# Patient Record
Sex: Male | Born: 1980 | Race: Black or African American | Hispanic: No | Marital: Single | Smoking: Never smoker
Health system: Southern US, Community
[De-identification: ages and names within clinical notes are randomized; demographics above are authoritative.]

## PROBLEM LIST (undated history)

## (undated) DIAGNOSIS — E669 Obesity, unspecified: Secondary | ICD-10-CM

## (undated) DIAGNOSIS — I10 Essential (primary) hypertension: Secondary | ICD-10-CM

## (undated) SURGERY — OPEN REDUCTION INTERNAL FIXATION (ORIF) HUMERAL SHAFT FRACTURE
Anesthesia: General | Laterality: Right

---
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
## 2002-03-05 ENCOUNTER — Emergency Department (HOSPITAL_COMMUNITY): Admission: EM | Admit: 2002-03-05 | Discharge: 2002-03-05 | Payer: Self-pay | Admitting: Emergency Medicine

## 2002-03-05 ENCOUNTER — Encounter: Payer: Self-pay | Admitting: Emergency Medicine

## 2002-03-19 ENCOUNTER — Emergency Department (HOSPITAL_COMMUNITY): Admission: EM | Admit: 2002-03-19 | Discharge: 2002-03-19 | Payer: Self-pay | Admitting: Emergency Medicine

## 2003-07-06 ENCOUNTER — Emergency Department (HOSPITAL_COMMUNITY): Admission: EM | Admit: 2003-07-06 | Discharge: 2003-07-06 | Payer: Self-pay | Admitting: Emergency Medicine

## 2006-10-26 ENCOUNTER — Emergency Department (HOSPITAL_COMMUNITY): Admission: EM | Admit: 2006-10-26 | Discharge: 2006-10-27 | Payer: Self-pay | Admitting: Emergency Medicine

## 2007-11-22 ENCOUNTER — Emergency Department (HOSPITAL_COMMUNITY): Admission: EM | Admit: 2007-11-22 | Discharge: 2007-11-23 | Payer: Self-pay | Admitting: Emergency Medicine

## 2009-05-25 ENCOUNTER — Emergency Department (HOSPITAL_COMMUNITY): Admission: EM | Admit: 2009-05-25 | Discharge: 2009-05-25 | Payer: Self-pay | Admitting: Emergency Medicine

## 2010-03-16 ENCOUNTER — Emergency Department (HOSPITAL_COMMUNITY): Admission: EM | Admit: 2010-03-16 | Discharge: 2010-03-16 | Payer: Self-pay | Admitting: Emergency Medicine

## 2010-09-03 ENCOUNTER — Inpatient Hospital Stay (INDEPENDENT_AMBULATORY_CARE_PROVIDER_SITE_OTHER)
Admission: RE | Admit: 2010-09-03 | Discharge: 2010-09-03 | Disposition: A | Discharge status: Home / Self Care | Payer: Self-pay | Source: Ambulatory Visit | Attending: Family Medicine | Admitting: Family Medicine

## 2010-09-03 DIAGNOSIS — J309 Allergic rhinitis, unspecified: Secondary | ICD-10-CM

## 2011-04-23 ENCOUNTER — Encounter: Payer: Self-pay | Admitting: Emergency Medicine

## 2011-04-23 ENCOUNTER — Emergency Department (HOSPITAL_COMMUNITY)
Admission: EM | Admit: 2011-04-23 | Discharge: 2011-04-23 | Disposition: A | Discharge status: Home / Self Care | Payer: Self-pay | Attending: Emergency Medicine | Admitting: Emergency Medicine

## 2011-04-23 ENCOUNTER — Emergency Department (HOSPITAL_COMMUNITY): Payer: Self-pay

## 2011-04-23 DIAGNOSIS — R0789 Other chest pain: Secondary | ICD-10-CM | POA: Insufficient documentation

## 2011-04-23 DIAGNOSIS — R0989 Other specified symptoms and signs involving the circulatory and respiratory systems: Secondary | ICD-10-CM | POA: Insufficient documentation

## 2011-04-23 DIAGNOSIS — R05 Cough: Secondary | ICD-10-CM | POA: Insufficient documentation

## 2011-04-23 DIAGNOSIS — R059 Cough, unspecified: Secondary | ICD-10-CM | POA: Insufficient documentation

## 2011-04-23 DIAGNOSIS — R0609 Other forms of dyspnea: Secondary | ICD-10-CM | POA: Insufficient documentation

## 2011-04-23 DIAGNOSIS — J45901 Unspecified asthma with (acute) exacerbation: Secondary | ICD-10-CM | POA: Insufficient documentation

## 2011-04-23 DIAGNOSIS — R0602 Shortness of breath: Secondary | ICD-10-CM | POA: Insufficient documentation

## 2011-04-23 DIAGNOSIS — I10 Essential (primary) hypertension: Secondary | ICD-10-CM | POA: Insufficient documentation

## 2011-04-23 HISTORY — DX: Essential (primary) hypertension: I10

## 2011-04-23 LAB — POCT I-STAT, CHEM 8
BUN: 13 mg/dL (ref 6–23)
Calcium, Ion: 1.09 mmol/L — ABNORMAL LOW (ref 1.12–1.32)
Chloride: 98 meq/L (ref 96–112)
Creatinine, Ser: 1 mg/dL (ref 0.50–1.35)
Glucose, Bld: 156 mg/dL — ABNORMAL HIGH (ref 70–99)
HCT: 46 % (ref 39.0–52.0)
Hemoglobin: 15.6 g/dL (ref 13.0–17.0)
Potassium: 3.7 meq/L (ref 3.5–5.1)
Sodium: 140 meq/L (ref 135–145)
TCO2: 31 mmol/L (ref 0–100)

## 2011-04-23 MED ORDER — CLONIDINE HCL 0.1 MG PO TABS
0.2000 mg | ORAL_TABLET | Freq: Once | ORAL | Status: AC
  Administered 2011-04-23: 0.2 mg via ORAL
  Filled 2011-04-23: qty 2

## 2011-04-23 MED ORDER — LISINOPRIL 20 MG PO TABS: 20.0000 mg | ORAL_TABLET | Freq: Every day | ORAL | Status: DC

## 2011-04-23 MED ORDER — ALBUTEROL SULFATE HFA 108 (90 BASE) MCG/ACT IN AERS: 1.0000 | INHALATION_SPRAY | Freq: Four times a day (QID) | RESPIRATORY_TRACT | Status: DC | PRN

## 2011-04-23 MED ORDER — HYDROCHLOROTHIAZIDE 25 MG PO TABS: 25.0000 mg | ORAL_TABLET | Freq: Every day | ORAL | Status: DC

## 2011-04-23 MED ORDER — ALBUTEROL SULFATE (5 MG/ML) 0.5% IN NEBU
INHALATION_SOLUTION | RESPIRATORY_TRACT | Status: AC
  Administered 2011-04-23: 10:00:00
  Filled 2011-04-23: qty 2

## 2011-04-23 MED ORDER — ALBUTEROL SULFATE HFA 108 (90 BASE) MCG/ACT IN AERS: 2.0000 | INHALATION_SPRAY | RESPIRATORY_TRACT | Status: DC | PRN

## 2011-04-23 MED ORDER — IPRATROPIUM BROMIDE 0.02 % IN SOLN
RESPIRATORY_TRACT | Status: AC
  Administered 2011-04-23: 10:00:00
  Filled 2011-04-23: qty 2.5

## 2011-04-23 NOTE — ED Notes (Signed)
Pt.was placed on the monitor. Call bell given to him and told to call if he needs anything.

## 2011-04-23 NOTE — ED Notes (Signed)
Patient transported to X-ray 

## 2011-04-23 NOTE — ED Notes (Signed)
EMS reports initial vitals of 164/92, 104 and spo2 of 91% before nebulizer.  184/106, 120 and spo2 of 100% after nebulizer

## 2011-04-23 NOTE — ED Provider Notes (Signed)
History     CSN: [id number] Arrival date & time: 04/23/2011  9:14 AM   First MD Initiated Contact with Patient 04/23/11 [phone]      Chief Complaint  Patient presents with  . Shortness of Breath    (Consider location/radiation/quality/duration/timing/severity/associated sxs/prior treatment) HPI Patient presents with difficulty breathing since around midnight last night.  Patient has previous history of asthma and hypertension.  Patient states he's had an upper respiratory illness infection for the last few days.  Had more difficulty breathing last night.  Her name is psoriasis morning he said he was wheezing and he was given a nebulizer which opened his airways up and seemed to improve his breathing dramatically.  Patient denies any chest pain but he does state that he had tightness in his chest.  He has had a cough and URI symptoms over the last few days.  Patient does denies fever.  His symptoms began to worsen around midnight last night and had continued until he called the ambulance.  Patient has no regular doctor. Past Medical History  Diagnosis Date  . Asthma     as child  . Hypertension     History reviewed. No pertinent past surgical history.  No family history on file.  History  Substance Use Topics  . Smoking status: Not on file  . Smokeless tobacco: Not on file  . Alcohol Use:       Review of Systems  Constitutional: Negative.   HENT: Negative.   Respiratory: Positive for chest tightness and wheezing.   Cardiovascular: Negative.   Gastrointestinal: Negative.   Musculoskeletal: Negative.   Psychiatric/Behavioral: Negative.     Allergies  Review of patient's allergies indicates no known allergies.  Home Medications  No current outpatient prescriptions on file.  BP 184/106  Pulse 120  SpO2 100%  Physical Exam  Nursing note and vitals reviewed. Constitutional: He is oriented to person, place, and time. He appears well-developed and well-nourished. No  distress.  HENT:  Head: Normocephalic and atraumatic.  Eyes: Pupils are equal, round, and reactive to light.  Neck: Normal range of motion.  Cardiovascular: Normal rate and intact distal pulses.   Pulmonary/Chest: No respiratory distress. He has wheezes.  Abdominal: Normal appearance. He exhibits no distension.  Musculoskeletal: Normal range of motion.  Neurological: He is alert and oriented to person, place, and time. No cranial nerve deficit.  Skin: Skin is warm and dry. No rash noted.  Psychiatric: He has a normal mood and affect. His behavior is normal.    ED Course  Procedures (including critical care time) Patient had been getting nebulized albuterol prior to arrival states he is improved. Labs Reviewed  POCT I-STAT, CHEM 8 - Abnormal; Notable for the following:    Glucose, Bld 156 (*)    Calcium, Ion 1.09 (*)    All other components within normal limits  PRO B NATRIURETIC PEPTIDE   Dg Chest 2 View  04/23/2011  *RADIOLOGY REPORT*  Clinical Data: Shortness of breath, cough, chest tightness  CHEST - 2 VIEW  Comparison: Chest x-ray of 10/26/2006  Findings: The lungs are clear.  Mild peribronchial thickening is present.  The heart is within normal limits in size.  No bony abnormality is seen.  IMPRESSION:  No pneumonia.  Mild peribronchial thickening.  Original Report Authenticated By: Juline Patch, M.D.     1. Asthma   2. Hypertension       MDM         Nelia Shi,  MD 04/23/11 1943

## 2011-04-23 NOTE — ED Notes (Signed)
ZOX:[id number]<AV> Expected date:04/23/11<BR> Expected time:<BR> Means of arrival:<BR> Comments:<BR> Ems, asthma and sob, elevated bp

## 2011-11-09 ENCOUNTER — Emergency Department (HOSPITAL_COMMUNITY): Payer: No Typology Code available for payment source

## 2011-11-09 ENCOUNTER — Emergency Department (HOSPITAL_COMMUNITY)
Admission: EM | Admit: 2011-11-09 | Discharge: 2011-11-09 | Disposition: A | Discharge status: Home / Self Care | Payer: No Typology Code available for payment source | Attending: Emergency Medicine | Admitting: Emergency Medicine

## 2011-11-09 ENCOUNTER — Encounter (HOSPITAL_COMMUNITY): Payer: Self-pay | Admitting: Emergency Medicine

## 2011-11-09 DIAGNOSIS — J45909 Unspecified asthma, uncomplicated: Secondary | ICD-10-CM | POA: Insufficient documentation

## 2011-11-09 DIAGNOSIS — M79609 Pain in unspecified limb: Secondary | ICD-10-CM | POA: Insufficient documentation

## 2011-11-09 DIAGNOSIS — I10 Essential (primary) hypertension: Secondary | ICD-10-CM | POA: Insufficient documentation

## 2011-11-09 DIAGNOSIS — S42302A Unspecified fracture of shaft of humerus, left arm, initial encounter for closed fracture: Secondary | ICD-10-CM

## 2011-11-09 DIAGNOSIS — E669 Obesity, unspecified: Secondary | ICD-10-CM | POA: Insufficient documentation

## 2011-11-09 DIAGNOSIS — S42309A Unspecified fracture of shaft of humerus, unspecified arm, initial encounter for closed fracture: Secondary | ICD-10-CM | POA: Insufficient documentation

## 2011-11-09 HISTORY — DX: Obesity, unspecified: E66.9

## 2011-11-09 MED ORDER — OXYCODONE-ACETAMINOPHEN 5-325 MG PO TABS: 2.0000 | ORAL_TABLET | ORAL | Status: AC | PRN

## 2011-11-09 MED ORDER — OXYCODONE-ACETAMINOPHEN 5-325 MG PO TABS
2.0000 | ORAL_TABLET | Freq: Once | ORAL | Status: AC
  Administered 2011-11-09: 2 via ORAL
  Filled 2011-11-09: qty 2

## 2011-11-09 MED ORDER — ONDANSETRON 4 MG PO TBDP
4.0000 mg | ORAL_TABLET | Freq: Once | ORAL | Status: AC
  Administered 2011-11-09: 4 mg via ORAL
  Filled 2011-11-09: qty 1

## 2011-11-09 MED ORDER — FENTANYL CITRATE 0.05 MG/ML IJ SOLN
100.0000 ug | Freq: Once | INTRAMUSCULAR | Status: AC
  Administered 2011-11-09: 100 ug via INTRAMUSCULAR
  Filled 2011-11-09: qty 2

## 2011-11-09 MED ORDER — IBUPROFEN 800 MG PO TABS: 800.0000 mg | ORAL_TABLET | Freq: Three times a day (TID) | ORAL | Status: AC

## 2011-11-09 MED ORDER — HYDROMORPHONE HCL PF 1 MG/ML IJ SOLN
1.0000 mg | Freq: Once | INTRAMUSCULAR | Status: AC
  Administered 2011-11-09: 1 mg via INTRAMUSCULAR
  Filled 2011-11-09: qty 1
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
## 2011-11-09 NOTE — Discharge Instructions (Signed)
Your humerus is the bone of your upper arm and it is fractured. This means that he will likely require surgery. I have discussed your care with Dr. Melvyn Novas who is the surgeon on call and his phone number I have listed above. Please call the office Monday morning and arrange a followup visit for 5-7 days. Keep the splint and sling on your arm, keep your arm elevated above her heart and use the pain medications as prescribed. Return to the hospital immediately for numbness or weakness of the hand, a cold hands, painful hand or fevers.

## 2011-11-10 ENCOUNTER — Encounter: Payer: Self-pay | Admitting: Emergency Medicine

## 2011-11-11 ENCOUNTER — Emergency Department (INDEPENDENT_AMBULATORY_CARE_PROVIDER_SITE_OTHER)
Admission: EM | Admit: 2011-11-11 | Discharge: 2011-11-11 | Disposition: A | Discharge status: Home / Self Care | Payer: No Typology Code available for payment source | Source: Home / Self Care | Attending: Emergency Medicine | Admitting: Emergency Medicine

## 2011-11-11 ENCOUNTER — Encounter (HOSPITAL_COMMUNITY): Payer: Self-pay | Admitting: Emergency Medicine

## 2011-11-11 DIAGNOSIS — I10 Essential (primary) hypertension: Secondary | ICD-10-CM

## 2011-11-11 MED ORDER — TRIAMTERENE-HCTZ 37.5-25 MG PO TABS: 1.0000 | ORAL_TABLET | Freq: Every day | ORAL | Status: DC

## 2011-11-11 MED ORDER — LISINOPRIL 40 MG PO TABS: 20.0000 mg | ORAL_TABLET | Freq: Every day | ORAL | Status: DC

## 2011-11-11 NOTE — Discharge Instructions (Signed)
Hypertension Information As your heart beats, it forces blood through your arteries. This force is your blood pressure. If the pressure is too high, it is called hypertension (HTN) or high blood pressure. HTN is dangerous because you may have it and not know it. High blood pressure may mean that your heart has to work harder to pump blood. Your arteries may be narrow or stiff. The extra work puts you at risk for heart disease, stroke, and other problems.  Blood pressure consists of two numbers, a higher number over a lower, 110/72, for example. It is stated as "110 over 72." The ideal is below 120 for the top number (systolic) and under 80 for the bottom (diastolic).  You should pay close attention to your blood pressure if you have certain conditions such as:  Heart failure.   Prior heart attack.   Diabetes   Chronic kidney disease.   Prior stroke.   Multiple risk factors for heart disease.  To see if you have HTN, your blood pressure should be measured while you are seated with your arm held at the level of the heart. It should be measured at least twice. A one-time elevated blood pressure reading (especially in the Emergency Department) does not mean that you need treatment. There may be conditions in which the blood pressure is different between your right and left arms. It is important to see your caregiver soon for a recheck. Most people have essential hypertension which means that there is not a specific cause. This type of high blood pressure may be lowered by changing lifestyle factors such as:  Stress.   Smoking.   Lack of exercise.   Excessive weight.   Drug/tobacco/alcohol use.   Eating less salt.  Most people do not have symptoms from high blood pressure until it has caused damage to the body. Effective treatment can often prevent, delay or reduce that damage. TREATMENT  Treatment for high blood pressure, when a cause has been identified, is directed at the cause. There  are a large number of medications to treat HTN. These fall into several categories, and your caregiver will help you select the medicines that are best for you. Medications may have side effects. You should review side effects with your caregiver. If your blood pressure stays high after you have made lifestyle changes or started on medicines,   Your medication(s) may need to be changed.   Other problems may need to be addressed.   Be certain you understand your prescriptions, and know how and when to take your medicine.   Be sure to follow up with your caregiver within the time frame advised (usually within two weeks) to have your blood pressure rechecked and to review your medications.   If you are taking more than one medicine to lower your blood pressure, make sure you know how and at what times they should be taken. Taking two medicines at the same time can result in blood pressure that is too low.  Document Released: 07/29/2005 Document Revised: 02/05/2011 Document Reviewed: 08/05/2007 [employer] Patient Information 2012 [employer]. 

## 2011-11-11 NOTE — ED Notes (Signed)
Vitals obtained by CMA student 
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
## 2011-11-11 NOTE — ED Notes (Signed)
Patient requesting refill for medications: lisinopril and hctz.  Patient has been seen at orthopedic doctors office today.

## 2011-11-27 ENCOUNTER — Emergency Department (HOSPITAL_COMMUNITY): Payer: No Typology Code available for payment source

## 2011-11-27 ENCOUNTER — Encounter (HOSPITAL_COMMUNITY): Payer: Self-pay | Admitting: *Deleted

## 2011-11-27 ENCOUNTER — Emergency Department (HOSPITAL_COMMUNITY)
Admission: EM | Admit: 2011-11-27 | Discharge: 2011-11-27 | Disposition: A | Discharge status: Home / Self Care | Payer: No Typology Code available for payment source | Attending: Emergency Medicine | Admitting: Emergency Medicine

## 2011-11-27 DIAGNOSIS — Z79899 Other long term (current) drug therapy: Secondary | ICD-10-CM | POA: Insufficient documentation

## 2011-11-27 DIAGNOSIS — M79603 Pain in arm, unspecified: Secondary | ICD-10-CM

## 2011-11-27 DIAGNOSIS — S42309S Unspecified fracture of shaft of humerus, unspecified arm, sequela: Secondary | ICD-10-CM | POA: Insufficient documentation

## 2011-11-27 DIAGNOSIS — Z8781 Personal history of (healed) traumatic fracture: Secondary | ICD-10-CM | POA: Insufficient documentation

## 2011-11-27 DIAGNOSIS — I10 Essential (primary) hypertension: Secondary | ICD-10-CM | POA: Insufficient documentation

## 2011-11-27 DIAGNOSIS — J45909 Unspecified asthma, uncomplicated: Secondary | ICD-10-CM | POA: Insufficient documentation

## 2011-11-27 DIAGNOSIS — M79609 Pain in unspecified limb: Secondary | ICD-10-CM | POA: Insufficient documentation

## 2011-11-27 MED ORDER — OXYCODONE-ACETAMINOPHEN 10-325 MG PO TABS: 2.0000 | ORAL_TABLET | ORAL | Status: DC | PRN

## 2011-11-27 NOTE — Discharge Instructions (Signed)
It is very important that you continue to have your condition managed by an orthopedist.  Please make sure to call tomorrow for the next available appointment.  Your evaluation tonight to check your arm pain did not result in demonstration of a new fracture.
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
## 2011-12-23 ENCOUNTER — Encounter (HOSPITAL_COMMUNITY): Payer: Self-pay | Admitting: *Deleted

## 2011-12-23 ENCOUNTER — Other Ambulatory Visit: Payer: Self-pay | Admitting: Orthopedic Surgery

## 2011-12-24 ENCOUNTER — Observation Stay (HOSPITAL_COMMUNITY): Payer: No Typology Code available for payment source

## 2011-12-24 ENCOUNTER — Encounter (HOSPITAL_COMMUNITY): Payer: Self-pay | Admitting: Certified Registered"

## 2011-12-24 ENCOUNTER — Ambulatory Visit (HOSPITAL_COMMUNITY): Payer: No Typology Code available for payment source | Admitting: Certified Registered"

## 2011-12-24 ENCOUNTER — Ambulatory Visit (HOSPITAL_COMMUNITY): Payer: No Typology Code available for payment source

## 2011-12-24 ENCOUNTER — Encounter (HOSPITAL_COMMUNITY): Payer: Self-pay | Admitting: Surgery

## 2011-12-24 ENCOUNTER — Encounter (HOSPITAL_COMMUNITY)
Admission: RE | Disposition: A | Discharge status: Home / Self Care | Payer: Self-pay | Source: Ambulatory Visit | Attending: Orthopedic Surgery

## 2011-12-24 ENCOUNTER — Observation Stay (HOSPITAL_COMMUNITY)
Admission: RE | Admit: 2011-12-24 | Discharge: 2011-12-25 | DRG: 494 | Disposition: A | Discharge status: Home / Self Care | Payer: No Typology Code available for payment source | Source: Ambulatory Visit | Attending: Orthopedic Surgery | Admitting: Orthopedic Surgery

## 2011-12-24 DIAGNOSIS — J45909 Unspecified asthma, uncomplicated: Secondary | ICD-10-CM | POA: Diagnosis present

## 2011-12-24 DIAGNOSIS — G8918 Other acute postprocedural pain: Secondary | ICD-10-CM

## 2011-12-24 DIAGNOSIS — S42409A Unspecified fracture of lower end of unspecified humerus, initial encounter for closed fracture: Principal | ICD-10-CM | POA: Diagnosis present

## 2011-12-24 DIAGNOSIS — I10 Essential (primary) hypertension: Secondary | ICD-10-CM | POA: Diagnosis present

## 2011-12-24 HISTORY — PX: ORIF HUMERUS FRACTURE: SHX2126

## 2011-12-24 LAB — URINALYSIS, ROUTINE W REFLEX MICROSCOPIC
Glucose, UA: NEGATIVE mg/dL
Hgb urine dipstick: NEGATIVE
Specific Gravity, Urine: 1.027 (ref 1.005–1.030)
Urobilinogen, UA: 0.2 mg/dL (ref 0.0–1.0)

## 2011-12-24 LAB — BASIC METABOLIC PANEL
CO2: 28 mEq/L (ref 19–32)
Chloride: 99 mEq/L (ref 96–112)
Creatinine, Ser: 0.85 mg/dL (ref 0.50–1.35)

## 2011-12-24 LAB — CBC
MCV: 85.7 fL (ref 78.0–100.0)
Platelets: 327 10*3/uL (ref 150–400)
RBC: 5.03 MIL/uL (ref 4.22–5.81)
WBC: 17.1 10*3/uL — ABNORMAL HIGH (ref 4.0–10.5)

## 2011-12-24 LAB — URINE MICROSCOPIC-ADD ON

## 2011-12-24 LAB — SURGICAL PCR SCREEN
MRSA, PCR: POSITIVE — AB
Staphylococcus aureus: POSITIVE — AB

## 2011-12-24 SURGERY — OPEN REDUCTION INTERNAL FIXATION (ORIF) HUMERAL SHAFT FRACTURE
Anesthesia: General | Site: Arm Upper | Laterality: Right | Wound class: Clean

## 2011-12-24 MED ORDER — PROMETHAZINE HCL 25 MG/ML IJ SOLN: 6.2500 mg | INTRAMUSCULAR | Status: DC | PRN

## 2011-12-24 MED ORDER — HYDROCHLOROTHIAZIDE 25 MG PO TABS
25.0000 mg | ORAL_TABLET | Freq: Every day | ORAL | Status: DC
  Administered 2011-12-24 – 2011-12-25 (×2): 25 mg via ORAL
  Filled 2011-12-24 (×2): qty 1

## 2011-12-24 MED ORDER — ENOXAPARIN SODIUM 40 MG/0.4ML ~~LOC~~ SOLN
40.0000 mg | SUBCUTANEOUS | Status: DC
  Administered 2011-12-25: 40 mg via SUBCUTANEOUS
  Filled 2011-12-24 (×2): qty 0.4

## 2011-12-24 MED ORDER — ROCURONIUM BROMIDE 100 MG/10ML IV SOLN
INTRAVENOUS | Status: DC | PRN
  Administered 2011-12-24: 50 mg via INTRAVENOUS

## 2011-12-24 MED ORDER — SUCCINYLCHOLINE CHLORIDE 20 MG/ML IJ SOLN
INTRAMUSCULAR | Status: DC | PRN
  Administered 2011-12-24: 200 mg via INTRAVENOUS

## 2011-12-24 MED ORDER — MENTHOL 3 MG MT LOZG
1.0000 | LOZENGE | OROMUCOSAL | Status: DC | PRN
  Filled 2011-12-24: qty 9

## 2011-12-24 MED ORDER — PHENOL 1.4 % MT LIQD: 1.0000 | OROMUCOSAL | Status: DC | PRN

## 2011-12-24 MED ORDER — CEFAZOLIN SODIUM-DEXTROSE 2-3 GM-% IV SOLR
INTRAVENOUS | Status: AC
  Administered 2011-12-24: 2 g via INTRAVENOUS
  Filled 2011-12-24: qty 50

## 2011-12-24 MED ORDER — ALBUTEROL SULFATE HFA 108 (90 BASE) MCG/ACT IN AERS: 2.0000 | INHALATION_SPRAY | Freq: Four times a day (QID) | RESPIRATORY_TRACT | Status: DC | PRN

## 2011-12-24 MED ORDER — ONDANSETRON HCL 4 MG PO TABS: 4.0000 mg | ORAL_TABLET | Freq: Four times a day (QID) | ORAL | Status: DC | PRN

## 2011-12-24 MED ORDER — HYDROMORPHONE HCL PF 1 MG/ML IJ SOLN
INTRAMUSCULAR | Status: AC
  Filled 2011-12-24: qty 1

## 2011-12-24 MED ORDER — MUPIROCIN 2 % EX OINT
TOPICAL_OINTMENT | Freq: Once | CUTANEOUS | Status: AC
  Administered 2011-12-24: 1 via NASAL

## 2011-12-24 MED ORDER — METOCLOPRAMIDE HCL 10 MG PO TABS: 5.0000 mg | ORAL_TABLET | Freq: Three times a day (TID) | ORAL | Status: DC | PRN

## 2011-12-24 MED ORDER — PROPOFOL 10 MG/ML IV EMUL
INTRAVENOUS | Status: DC | PRN
  Administered 2011-12-24: 300 mg via INTRAVENOUS

## 2011-12-24 MED ORDER — CEFAZOLIN SODIUM 1-5 GM-% IV SOLN
1.0000 g | Freq: Four times a day (QID) | INTRAVENOUS | Status: AC
  Administered 2011-12-25: 1 g via INTRAVENOUS
  Filled 2011-12-24 (×3): qty 50

## 2011-12-24 MED ORDER — LIDOCAINE HCL (CARDIAC) 20 MG/ML IV SOLN
INTRAVENOUS | Status: DC | PRN
  Administered 2011-12-24: 40 mg via INTRAVENOUS

## 2011-12-24 MED ORDER — MEPERIDINE HCL 25 MG/ML IJ SOLN: 6.2500 mg | INTRAMUSCULAR | Status: DC | PRN

## 2011-12-24 MED ORDER — LACTATED RINGERS IV SOLN
INTRAVENOUS | Status: DC | PRN
  Administered 2011-12-24 (×2): via INTRAVENOUS

## 2011-12-24 MED ORDER — OXYCODONE-ACETAMINOPHEN 5-325 MG PO TABS
2.0000 | ORAL_TABLET | ORAL | Status: DC | PRN
  Administered 2011-12-24 – 2011-12-25 (×6): 2 via ORAL
  Filled 2011-12-24 (×5): qty 2

## 2011-12-24 MED ORDER — HYDROMORPHONE HCL PF 1 MG/ML IJ SOLN
0.5000 mg | INTRAMUSCULAR | Status: DC | PRN
  Administered 2011-12-24 – 2011-12-25 (×4): 1 mg via INTRAVENOUS
  Filled 2011-12-24 (×4): qty 1

## 2011-12-24 MED ORDER — OXYCODONE HCL 5 MG PO TABS
10.0000 mg | ORAL_TABLET | ORAL | Status: DC | PRN
  Administered 2011-12-24 – 2011-12-25 (×5): 10 mg via ORAL
  Filled 2011-12-24 (×4): qty 2

## 2011-12-24 MED ORDER — HYDROMORPHONE HCL PF 1 MG/ML IJ SOLN
0.2500 mg | INTRAMUSCULAR | Status: DC | PRN
  Administered 2011-12-24 (×5): 0.5 mg via INTRAVENOUS

## 2011-12-24 MED ORDER — ACETAMINOPHEN 10 MG/ML IV SOLN
INTRAVENOUS | Status: DC | PRN
  Administered 2011-12-24: 1000 mg via INTRAVENOUS

## 2011-12-24 MED ORDER — NEOSTIGMINE METHYLSULFATE 1 MG/ML IJ SOLN
INTRAMUSCULAR | Status: DC | PRN
  Administered 2011-12-24: 3 mg via INTRAVENOUS

## 2011-12-24 MED ORDER — METHOCARBAMOL 500 MG PO TABS
500.0000 mg | ORAL_TABLET | Freq: Four times a day (QID) | ORAL | Status: DC | PRN
Start: 2011-12-24 — End: 2011-12-25
  Administered 2011-12-24 – 2011-12-25 (×4): 500 mg via ORAL
  Filled 2011-12-24 (×4): qty 1

## 2011-12-24 MED ORDER — ONDANSETRON HCL 4 MG/2ML IJ SOLN: 4.0000 mg | Freq: Four times a day (QID) | INTRAMUSCULAR | Status: DC | PRN

## 2011-12-24 MED ORDER — OXYCODONE-ACETAMINOPHEN 5-325 MG PO TABS
ORAL_TABLET | ORAL | Status: AC
  Filled 2011-12-24: qty 2

## 2011-12-24 MED ORDER — MIDAZOLAM HCL 2 MG/2ML IJ SOLN: 0.5000 mg | Freq: Once | INTRAMUSCULAR | Status: DC | PRN

## 2011-12-24 MED ORDER — METHOCARBAMOL 500 MG PO TABS
ORAL_TABLET | ORAL | Status: AC
  Filled 2011-12-24: qty 1

## 2011-12-24 MED ORDER — MIDAZOLAM HCL 5 MG/5ML IJ SOLN
INTRAMUSCULAR | Status: DC | PRN
  Administered 2011-12-24: 2 mg via INTRAVENOUS

## 2011-12-24 MED ORDER — ACETAMINOPHEN 10 MG/ML IV SOLN
INTRAVENOUS | Status: AC
  Filled 2011-12-24: qty 100

## 2011-12-24 MED ORDER — CHLORHEXIDINE GLUCONATE 4 % EX LIQD: 60.0000 mL | Freq: Once | CUTANEOUS | Status: DC

## 2011-12-24 MED ORDER — ACETAMINOPHEN 650 MG RE SUPP: 650.0000 mg | Freq: Four times a day (QID) | RECTAL | Status: DC | PRN

## 2011-12-24 MED ORDER — ACETAMINOPHEN 325 MG PO TABS: 650.0000 mg | ORAL_TABLET | Freq: Four times a day (QID) | ORAL | Status: DC | PRN

## 2011-12-24 MED ORDER — METOCLOPRAMIDE HCL 5 MG/ML IJ SOLN: 5.0000 mg | Freq: Three times a day (TID) | INTRAMUSCULAR | Status: DC | PRN

## 2011-12-24 MED ORDER — MUPIROCIN 2 % EX OINT
TOPICAL_OINTMENT | CUTANEOUS | Status: AC
  Administered 2011-12-24: 1 via NASAL
  Filled 2011-12-24: qty 22

## 2011-12-24 MED ORDER — FENTANYL CITRATE 0.05 MG/ML IJ SOLN
INTRAMUSCULAR | Status: DC | PRN
  Administered 2011-12-24 (×2): 50 ug via INTRAVENOUS
  Administered 2011-12-24 (×2): 100 ug via INTRAVENOUS
  Administered 2011-12-24: 50 ug via INTRAVENOUS
  Administered 2011-12-24: 100 ug via INTRAVENOUS
  Administered 2011-12-24: 50 ug via INTRAVENOUS

## 2011-12-24 MED ORDER — LISINOPRIL 20 MG PO TABS
20.0000 mg | ORAL_TABLET | Freq: Every day | ORAL | Status: DC
  Administered 2011-12-24 – 2011-12-25 (×2): 20 mg via ORAL
  Filled 2011-12-24 (×2): qty 1

## 2011-12-24 MED ORDER — METHOCARBAMOL 100 MG/ML IJ SOLN
500.0000 mg | Freq: Four times a day (QID) | INTRAVENOUS | Status: DC | PRN
  Filled 2011-12-24: qty 5

## 2011-12-24 MED ORDER — GLYCOPYRROLATE 0.2 MG/ML IJ SOLN
INTRAMUSCULAR | Status: DC | PRN
  Administered 2011-12-24: 0.4 mg via INTRAVENOUS

## 2011-12-24 MED ORDER — BUPIVACAINE-EPINEPHRINE PF 0.5-1:200000 % IJ SOLN
INTRAMUSCULAR | Status: DC | PRN
  Administered 2011-12-24: 30 mL

## 2011-12-24 MED ORDER — ONDANSETRON HCL 4 MG/2ML IJ SOLN
INTRAMUSCULAR | Status: DC | PRN
  Administered 2011-12-24: 4 mg via INTRAVENOUS

## 2011-12-24 MED ORDER — OXYCODONE HCL 5 MG PO TABS
ORAL_TABLET | ORAL | Status: AC
  Filled 2011-12-24: qty 2

## 2011-12-24 MED ORDER — CEFAZOLIN SODIUM-DEXTROSE 2-3 GM-% IV SOLR: 2.0000 g | INTRAVENOUS | Status: DC

## 2011-12-24 MED ORDER — OXYCODONE-ACETAMINOPHEN 10-325 MG PO TABS: 2.0000 | ORAL_TABLET | ORAL | Status: DC | PRN

## 2011-12-24 MED ORDER — 0.9 % SODIUM CHLORIDE (POUR BTL) OPTIME
TOPICAL | Status: DC | PRN
  Administered 2011-12-24: 1000 mL

## 2011-12-24 SURGICAL SUPPLY — 45 items
3.8MM DRILL BIT ×1 IMPLANT
BANDAGE ELASTIC 6 VELCRO ST LF (GAUZE/BANDAGES/DRESSINGS) ×1 IMPLANT
BANDAGE GAUZE ELAST BULKY 4 IN (GAUZE/BANDAGES/DRESSINGS) ×2 IMPLANT
BIT DRILL 3.2 QC DISP (BIT) ×1 IMPLANT
CLOTH BEACON ORANGE TIMEOUT ST (SAFETY) ×2 IMPLANT
DRAPE C-ARM 42X72 X-RAY (DRAPES) ×2 IMPLANT
DRAPE INCISE IOBAN 66X45 STRL (DRAPES) ×1 IMPLANT
DRSG PAD ABDOMINAL 8X10 ST (GAUZE/BANDAGES/DRESSINGS) ×4 IMPLANT
DURAPREP 26ML APPLICATOR (WOUND CARE) ×1 IMPLANT
ELECT REM PT RETURN 9FT ADLT (ELECTROSURGICAL) ×2
ELECTRODE REM PT RTRN 9FT ADLT (ELECTROSURGICAL) ×1 IMPLANT
GAUZE XEROFORM 1X8 LF (GAUZE/BANDAGES/DRESSINGS) ×1 IMPLANT
GAUZE XEROFORM 5X9 LF (GAUZE/BANDAGES/DRESSINGS) ×1 IMPLANT
GLOVE ECLIPSE 7.0 STRL STRAW (GLOVE) ×1 IMPLANT
GLOVE SS PI 9.0 STRL (GLOVE) ×4 IMPLANT
GOWN PREVENTION PLUS XLARGE (GOWN DISPOSABLE) ×2 IMPLANT
GOWN STRL NON-REIN LRG LVL3 (GOWN DISPOSABLE) ×3 IMPLANT
KIT BASIN OR (CUSTOM PROCEDURE TRAY) ×2 IMPLANT
KIT ROOM TURNOVER OR (KITS) ×2 IMPLANT
NEEDLE 22X1 1/2 (OR ONLY) (NEEDLE) IMPLANT
NS IRRIG 1000ML POUR BTL (IV SOLUTION) ×2 IMPLANT
PACK SHOULDER (CUSTOM PROCEDURE TRAY) ×2 IMPLANT
PAD ARMBOARD 7.5X6 YLW CONV (MISCELLANEOUS) ×4 IMPLANT
PLATE LOCK COMP 7H (Plate) ×1 IMPLANT
SCREW LOCK CORT HEX 4.5X34 (Screw) ×1 IMPLANT
SCREW LOCK CORT HEX 4.5X38 (Screw) ×1 IMPLANT
SCREW NLOCK CORT 4.5X34 (Screw) ×1 IMPLANT
SCREW NLOCK CORT 4.5X40 (Screw) ×1 IMPLANT
SCREW NLOCK CORT 4.5X42 (Screw) ×2 IMPLANT
SCREW NLOCK CORT 4.5X44 (Screw) ×1 IMPLANT
SLING ARM IMMOBILIZER XL (CAST SUPPLIES) ×1 IMPLANT
SPONGE GAUZE 4X4 12PLY (GAUZE/BANDAGES/DRESSINGS) ×2 IMPLANT
SPONGE LAP 18X18 X RAY DECT (DISPOSABLE) ×1 IMPLANT
SPONGE LAP 4X18 X RAY DECT (DISPOSABLE) ×4 IMPLANT
STAPLER VISISTAT 35W (STAPLE) ×2 IMPLANT
SUCTION FRAZIER TIP 10 FR DISP (SUCTIONS) ×2 IMPLANT
SUT VIC AB 0 CT1 27 (SUTURE) ×4
SUT VIC AB 0 CT1 27XBRD ANBCTR (SUTURE) ×2 IMPLANT
SUT VIC AB 2-0 CT1 27 (SUTURE) ×4
SUT VIC AB 2-0 CT1 TAPERPNT 27 (SUTURE) ×2 IMPLANT
SYR CONTROL 10ML LL (SYRINGE) IMPLANT
TOWEL OR 17X24 6PK STRL BLUE (TOWEL DISPOSABLE) ×2 IMPLANT
TOWEL OR 17X26 10 PK STRL BLUE (TOWEL DISPOSABLE) ×2 IMPLANT
TUBE CONNECTING 12X1/4 (SUCTIONS) ×1 IMPLANT
YANKAUER SUCT BULB TIP NO VENT (SUCTIONS) ×3 IMPLANT

## 2011-12-24 NOTE — Anesthesia Postprocedure Evaluation (Signed)
  Anesthesia Post-op Note  Patient: Justin Brown  Procedure(s) Performed: Procedure(s) (LRB): OPEN REDUCTION INTERNAL FIXATION (ORIF) HUMERAL SHAFT FRACTURE (Right)  Patient Location: PACU  Anesthesia Type: GA combined with regional for post-op pain  Level of Consciousness: awake, alert  and oriented  Airway and Oxygen Therapy: Patient Spontanous Breathing and Patient connected to nasal cannula oxygen  Post-op Pain: mild  Post-op Assessment: Post-op Vital signs reviewed, Patient's Cardiovascular Status Stable, Respiratory Function Stable, Patent Airway, No signs of Nausea or vomiting and Pain level controlled  Post-op Vital Signs: Reviewed and stable  Complications: No apparent anesthesia complications

## 2011-12-24 NOTE — Anesthesia Preprocedure Evaluation (Addendum)
Anesthesia Evaluation  Patient identified by MRN, date of birth, ID band Patient awake    Reviewed: Allergy & Precautions, H&P , NPO status , Patient's Chart, lab work & pertinent test results  History of Anesthesia Complications Negative for: history of anesthetic complications (no prior surgery)  Airway Mallampati: III TM Distance: >3 FB Neck ROM: Full    Dental No notable dental hx. (+) Teeth Intact and Dental Advisory Given   Pulmonary asthma ,  breath sounds clear to auscultation  Pulmonary exam normal       Cardiovascular hypertension, Pt. on medications Rhythm:Regular Rate:Normal     Neuro/Psych negative neurological ROS     GI/Hepatic negative GI ROS, Neg liver ROS,   Endo/Other  Morbid obesity  Renal/GU negative Renal ROS     Musculoskeletal   Abdominal (+) + obese,   Peds  Hematology negative hematology ROS (+)   Anesthesia Other Findings   Reproductive/Obstetrics                          Anesthesia Physical Anesthesia Plan  ASA: III  Anesthesia Plan: General   Post-op Pain Management:    Induction: Intravenous and Rapid sequence  Airway Management Planned: Oral ETT  Additional Equipment:   Intra-op Plan:   Post-operative Plan: Extubation in OR  Informed Consent: I have reviewed the patients History and Physical, chart, labs and discussed the procedure including the risks, benefits and alternatives for the proposed anesthesia with the patient or authorized representative who has indicated his/her understanding and acceptance.   Dental advisory given  Plan Discussed with: Anesthesiologist, Surgeon and CRNA  Anesthesia Plan Comments: (Plan routine monitors, GETA (RSI) with interscalene block for post op analgesia)       Anesthesia Quick Evaluation
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
## 2011-12-24 NOTE — Brief Op Note (Signed)
12/24/2011  1:57 PM  PATIENT:  Justin Brown  31 y.o. male  PRE-OPERATIVE DIAGNOSIS:  FRACTURED RIGHT HUMERUS  POST-OPERATIVE DIAGNOSIS:  FRACTURED RIGHT HUMERUS  PROCEDURE:  Procedure(s) (LRB): OPEN REDUCTION INTERNAL FIXATION (ORIF) HUMERAL SHAFT FRACTURE (Right)  SURGEON:  Surgeon(s) and Role:    * Kennieth Rad, MD - Primary  PHYSICIAN ASSISTANT:   ASSISTANTS: none   ANESTHESIA:   general  EBL:  Total I/O In: 1700 [I.V.:1700] Out: 100 [Blood:100]  BLOOD ADMINISTERED:none  DRAINS: none   LOCAL MEDICATIONS USED:  NONE  SPECIMEN:  No Specimen  DISPOSITION OF SPECIMEN:  N/A  COUNTS:  YES  TOURNIQUET:  * No tourniquets in log *  DICTATION: .Other Dictation: Dictation Number [id number]  PLAN OF CARE: Admit for overnight observation  PATIENT DISPOSITION:  PACU - hemodynamically stable.   Delay start of Pharmacological VTE agent (>24hrs) due to surgical blood loss or risk of bleeding: not applicable

## 2011-12-24 NOTE — Progress Notes (Signed)
Dr. Michelle Piper notified of patient having one cup of water after 0300 this am. Dr. Michelle Piper said "ok"

## 2011-12-24 NOTE — Transfer of Care (Signed)
Immediate Anesthesia Transfer of Care Note  Patient: Justin Brown  Procedure(s) Performed: Procedure(s) (LRB): OPEN REDUCTION INTERNAL FIXATION (ORIF) HUMERAL SHAFT FRACTURE (Right)  Patient Location: PACU  Anesthesia Type: General  Level of Consciousness: awake, alert , oriented and patient cooperative  Airway & Oxygen Therapy: Patient Spontanous Breathing and Patient connected to face mask oxygen  Post-op Assessment: Report given to PACU RN, Post -op Vital signs reviewed and stable and Patient moving all extremities  Post vital signs: Reviewed and stable  Complications: No apparent anesthesia complications
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
## 2011-12-24 NOTE — Anesthesia Procedure Notes (Addendum)
Anesthesia Regional Block:  Interscalene brachial plexus block  Pre-Anesthetic Checklist: ,, timeout performed, Correct Patient, Correct Site, Correct Laterality, Correct Procedure, Correct Position, site marked, Risks and benefits discussed,  Surgical consent,  Pre-op evaluation,  At surgeon's request and post-op pain management  Laterality: Right  Prep: chloraprep       Needles:  Injection technique: Single-shot  Needle Type: Echogenic Stimulator Needle     Needle Length: 5cm 5 cm     Additional Needles:  Procedures: ultrasound guided and nerve stimulator Interscalene brachial plexus block  Nerve Stimulator or Paresthesia:  Response: 0.4 mA,   Additional Responses:   Narrative:  Start time: 12/24/2011 8:20 AM End time: 12/24/2011 8:40 AM Injection made incrementally with aspirations every 5 mL. Anesthesiologist: Arta Bruce MD  Additional Notes: Monitors applied. Patient sedated. Sterile prep and drape,hand hygiene and sterile gloves were used. Relevant anatomy identified.Needle position confirmed.Local anesthetic injected incrementally after negative aspiration. Local anesthetic spread visualized around nerve(s). Vascular puncture avoided. No complications. Image printed for medical record.The patient tolerated the procedure well.       Interscalene brachial plexus block Procedure Name: Intubation Date/Time: 12/24/2011 10:29 AM Performed by: Arlice Colt B Pre-anesthesia Checklist: Patient identified, Emergency Drugs available, Suction available, Patient being monitored and Timeout performed Patient Re-evaluated:Patient Re-evaluated prior to inductionOxygen Delivery Method: Circle system utilized Preoxygenation: Pre-oxygenation with 100% oxygen Intubation Type: IV induction and Rapid sequence Grade View: Grade I Tube type: Oral Tube size: 7.5 mm Number of attempts: 1 Airway Equipment and Method: Video-laryngoscopy and Stylet Placement Confirmation: ETT inserted  through vocal cords under direct vision,  positive ETCO2 and breath sounds checked- equal and bilateral Secured at: 23 cm Tube secured with: Tape Dental Injury: Teeth and Oropharynx as per pre-operative assessment

## 2011-12-25 MED ORDER — OXYCODONE-ACETAMINOPHEN 5-325 MG PO TABS: 2.0000 | ORAL_TABLET | ORAL | Status: AC | PRN

## 2011-12-25 MED ORDER — MUPIROCIN 2 % EX OINT
TOPICAL_OINTMENT | Freq: Two times a day (BID) | CUTANEOUS | Status: DC
  Administered 2011-12-25: 10:00:00 via NASAL
  Filled 2011-12-25: qty 22

## 2011-12-25 MED ORDER — OXYCODONE HCL 10 MG PO TABS: 10.0000 mg | ORAL_TABLET | ORAL | Status: AC | PRN

## 2011-12-25 MED ORDER — DEXAMETHASONE 0.75 MG PO TABS: 4.0000 mg | ORAL_TABLET | Freq: Once | ORAL | Status: AC

## 2011-12-25 MED ORDER — OXYCODONE-ACETAMINOPHEN 7.5-325 MG PO TABS: 1.0000 | ORAL_TABLET | ORAL | Status: AC | PRN
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
## 2011-12-25 NOTE — Progress Notes (Deleted)
Occupational Therapy Evaluation Patient Details Name: Justin Brown MRN: [id number] DOB: 04-13-1981 Today's Date: 12/25/2011 Time: 0945-1000 OT Time Calculation (min): 15 min  OT Assessment / Plan / Recommendation Clinical Impression  31 yo s/p 1 level fusion L4- 5. Pt will benefit from skilled Ot services to max independence with ADL and functional moiblity for ADL to facilitate D/C home with wife.    OT Assessment  Patient needs continued OT Services    Follow Up Recommendations  No OT follow up    Barriers to Discharge None    Equipment Recommendations  3 in 1 bedside comode    Recommendations for Other Services    Frequency  Min 2X/week    Precautions / Restrictions Precautions Precautions: Back Precaution Booklet Issued: Yes (comment) Required Braces or Orthoses: Spinal Brace Spinal Brace: Lumbar corset Restrictions Weight Bearing Restrictions: No   Pertinent Vitals/Pain 6. PCA    ADL  Eating/Feeding: Simulated;Independent Where Assessed - Eating/Feeding: Chair Grooming: Simulated;Supervision/safety Where Assessed - Grooming: Supported standing Upper Body Bathing: Simulated;Set up Where Assessed - Upper Body Bathing: Unsupported sitting Lower Body Bathing: Simulated;Moderate assistance Where Assessed - Lower Body Bathing: Unsupported sit to stand Upper Body Dressing: Simulated;Minimal assistance Where Assessed - Upper Body Dressing: Unsupported sitting Lower Body Dressing: Performed;Moderate assistance Where Assessed - Lower Body Dressing: Unsupported sit to stand Toilet Transfer: Performed;Supervision/safety Toilet Transfer Method: Sit to Barista: Bedside commode Toileting - Clothing Manipulation and Hygiene: Simulated;Minimal assistance Where Assessed - Engineer, mining and Hygiene: Sit to stand from 3-in-1 or toilet Tub/Shower Transfer: Simulated;Supervision/safety Tub/Shower Transfer Method: Stand  pivot;Ambulating Tub/Shower Transfer Equipment: Walk in shower Equipment Used: Back brace Transfers/Ambulation Related to ADLs: S ADL Comments: no knowledge of back precuaitons with ADL    OT Diagnosis: Generalized weakness;Acute pain  OT Problem List: Decreased strength;Decreased range of motion;Decreased activity tolerance;Decreased safety awareness;Decreased knowledge of use of DME or AE;Decreased knowledge of precautions;Pain OT Treatment Interventions: Self-care/ADL training;DME and/or AE instruction;Therapeutic activities;Patient/family education   OT Goals Acute Rehab OT Goals OT Goal Formulation: With patient Time For Goal Achievement: 01/01/12 Potential to Achieve Goals: Good ADL Goals Pt Will Perform Lower Body Bathing: with supervision;with caregiver independent in assisting;Unsupported;Sit to stand from chair;with adaptive equipment ADL Goal: Lower Body Bathing - Progress: Goal set today Pt Will Perform Lower Body Dressing: with supervision;with caregiver independent in assisting;Sit to stand from chair;Unsupported;with adaptive equipment ADL Goal: Lower Body Dressing - Progress: Goal set today Pt Will Transfer to Toilet: with modified independence;Ambulation ADL Goal: Toilet Transfer - Progress: Goal set today Pt Will Perform Toileting - Clothing Manipulation: with supervision;Standing ADL Goal: Toileting - Clothing Manipulation - Progress: Goal set today Pt Will Perform Toileting - Hygiene: Sit to stand from 3-in-1/toilet;with cueing (comment type and amount);with supervision ADL Goal: Toileting - Hygiene - Progress: Goal set today Additional ADL Goal #1: Pt will independnetly state 3/3 back precautions. 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, contracting  Communication: No difficulties Dominant Hand: Right      Cognition  Overall Cognitive Status: Appears within functional limits for tasks assessed/performed Arousal/Alertness: Awake/alert Orientation Level: Appears intact for tasks assessed Behavior During Session: [facility] for tasks performed    Extremity/Trunk Assessment Right Upper Extremity Assessment RUE ROM/Strength/Tone: [facility] for tasks assessed Left Upper Extremity Assessment LUE ROM/Strength/Tone: WFL for tasks assessed Trunk Assessment Trunk Assessment: Normal   Mobility Bed Mobility Bed Mobility: Rolling Right;Right Sidelying to Sit Rolling Right: 5: Supervision Transfers Transfers: Sit to Stand;Stand to Sit Sit to Stand: 5: Supervision;From bed Stand to Sit: 5: Supervision;To chair/3-in-1   Exercise    Balance  WFL  End of Session OT - End of Session Equipment Utilized During Treatment: Back brace Activity Tolerance: Patient tolerated treatment well Patient left: in bed;with call bell/phone within reach;with family/visitor present  GO     Muhamed Luecke,HILLARY 12/25/2011, 1:23 PM [facility], OTR/L  [phone] 12/25/2011

## 2011-12-25 NOTE — Progress Notes (Signed)
Occupational Therapy Treatment Patient Details Name: Justin Brown MRN: [id number] DOB: [birth year] Today's Date: 12/25/2011 Time: 4098-1191 OT Time Calculation (min): 45 min  OT Assessment / Plan / Recommendation Comments on Treatment Session Pt. tolerated splint fabrication.  Will monitor splint for signs of pressure.       Follow Up Recommendations  Outpatient OT    Barriers to Discharge       Equipment Recommendations  None recommended by OT    Recommendations for Other Services    Frequency Min 2X/week   Plan Discharge plan remains appropriate    Precautions / Restrictions Precautions Precautions: Other (comment) Precaution Comments: elbow, hand/wrist ROM A/AROM; shoulder A/AAROM to 90 . 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  Pt. demonstrates ~75% active MCP extension  and trace thumb extension.  Splint fitted and will be checked in ~30 mins.    OT Diagnosis:    OT Problem List:   OT Treatment Interventions:     OT Goals Arm Goals Additional Arm Goal #1: Pt/family will be independent with donning/doffing R wrist cock up splint and wearing schedule Arm Goal: Additional Goal #1 - Progress: Progressing toward goals Arm Goal: Additional Goal #2 - Progress: Progressing toward goals  Visit Information  Last OT Received On: 12/25/11 Assistance Needed: +1    Subjective Data      Prior Functioning       Cognition  Overall Cognitive Status: Appears within functional limits for tasks assessed/performed Arousal/Alertness: Awake/alert Orientation Level: Appears intact for tasks assessed Behavior During Session: [facility] for tasks performed    Mobility     Exercises    Balance    End of Session  OT - End of Session Activity Tolerance: Patient tolerated treatment well Patient left: in chair;with call bell/phone within reach;with family/visitor present  GO     Justin Brown 12/25/2011, 2:51 PM

## 2011-12-25 NOTE — Progress Notes (Signed)
Occupational Therapy Treatment Patient Details Name: DEMONTREZ RINDFLEISCH MRN: [id number] DOB: 11-29-1980 Today's Date: 12/25/2011 Time: 7829-5621 OT Time Calculation (min): 29 min  OT Assessment / Plan / Recommendation Comments on Treatment Session Pt. tolerating splint with no problems.  Pt. and family have been instructed in splint wear and care, ADLs, precautions, and HEP.     Follow Up Recommendations  Outpatient OT    Barriers to Discharge       Equipment Recommendations  None recommended by OT    Recommendations for Other Services    Frequency Min 2X/week   Plan Discharge plan remains appropriate    Precautions / Restrictions Precautions Precautions: Other (comment) Precaution Comments: elbow, hand/wrist ROM A/AROM; shoulder A/AAROM to 90 . NWB Required Braces or Orthoses: Other Brace/Splint Other Brace/Splint: wear sling when not in bed Restrictions Weight Bearing Restrictions: Yes RUE Weight Bearing: Non weight bearing   Pertinent Vitals/Pain     ADL  ADL Comments: Pt. and family were instructed in proper method for UB ADLs.  They have a tank top and verbalize understanding of how to don/doff.  Discussed how to bathe Rt. axilla.  Pt. reports no pain from splint.  He was instructed how to don/doff splint, and skin inspected when splint removed with no signs/symptoms of redness.  Pt. and family were provided with written information re: splint wear and care.  Pt. instructed in finger and wrist self ROM, as well as shoulder ROM to 90 in supine position.  Pt. demonstrates independence with pendulum exercises, and verbalize understanding of sling wear and care    OT Diagnosis:    OT Problem List:   OT Treatment Interventions:     OT Goals ADL Goals ADL Goal: Upper Body Bathing - Progress: Progressing toward goals ADL Goal: Upper Body Dressing - Progress: Progressing toward goals ADL Goal: Additional Goal #1 - Progress: Progressing toward goals Arm Goals Arm Goal:  AROM - Progress: Progressing toward goal Additional Arm Goal #1: Pt/family will be independent with donning/doffing R wrist cock up splint and wearing schedule Arm Goal: Additional Goal #1 - Progress: Met Arm Goal: Additional Goal #2 - Progress: Progressing toward goals  Visit Information  Last OT Received On: 12/25/11 Assistance Needed: +1    Subjective Data      Prior Functioning       Cognition  Overall Cognitive Status: Appears within functional limits for tasks assessed/performed Arousal/Alertness: Awake/alert Orientation Level: Appears intact for tasks assessed Behavior During Session: [facility] for tasks performed    Mobility Transfers Sit to Stand: 6: Modified independent (Device/Increase time)   Exercises Shoulder Exercises Pendulum Exercise: AAROM;Right;10 reps;Standing Wrist Flexion: Self ROM;10 reps;Seated Wrist Extension: Self ROM;10 reps;Seated Digit Composite Flexion: AROM;10 reps;Seated;Other (comment);Limitations  Balance    End of Session OT - End of Session Activity Tolerance: Patient tolerated treatment well Patient left: in chair;with call bell/phone within reach;with family/visitor present Nurse Communication: Patient requests pain meds  GO     Laverne Hursey M 12/25/2011, 3:00 PM

## 2011-12-25 NOTE — Op Note (Signed)
[name], Justin Brown NO.:  [id number]  MEDICAL RECORD NO.:  [id number]  LOCATION:  [id number]                        FACILITY:  [facility]  PHYSICIAN:  Myrtie Neither, MD      DATE OF BIRTH:  [birth year]  DATE OF PROCEDURE:  12/24/2011 DATE OF DISCHARGE:                              OPERATIVE REPORT   PREOPERATIVE DIAGNOSIS:  Comminuted right distal humeral fracture.  POSTOPERATIVE DIAGNOSIS:  Comminuted right distal humeral fracture.  ANESTHESIA:  General.  PROCEDURE:  Open reduction and internal fixation of right comminuted humeral fracture.  The patient was taken to the operating room.  After given adequate preop medications, given general anesthesia and intubated.  C-arm was used to visualize the fracture of reduction.  A lateral incision was made with the right arm going through the skin, subcutaneous tissue, down to the fascia between the biceps and at the distal end of the deltoid extending distally down along the brachioradialis.  Sharp and blunt dissection was made down to the fracture site.  Fibrotic and early callous formation was noted about the area. Fracture site was a three-part fracture, large medial fragment, and a malaligned distal fragment.  After tremendous amount of soft tissue release both proximally and distally to free the fracture fragments out, with some traction and manipulation, the large proximal and large distal fragments were approximated.  There was a medial separate fragment, which was brought into a bone contact both proximally and distally.  A 7-hole compression plate was used with the use of 3 locking screws and 4 nonlocking screws, holding the fracture fragments in good alignment.  Three screws were able to be placed through the medial fragment.  This was visualized in AP, lateral, and oblique views. Copious irrigation was done, followed by wound closure with the use of 0 Vicryl for the fascia, 2-0 for the subcutaneous, and the skin  was approximated with skin staples.  Bulky compressive dressing was applied. The patient tolerated the procedure quite well and went to recovery room in stable and satisfactory condition.  The patient will be admitted for 23-hour observation and will be discharged with use of Percocet 2 q.4 hours p.r.n. for pain, ice packs and elevation, use of immobilizing sling, and return to the office in 1 week.     Myrtie Neither, MD     AC/MEDQ  D:  12/24/2011  T:  12/25/2011  Job:  [id number]

## 2011-12-25 NOTE — Progress Notes (Signed)
4782 IV was out of patient's LAC. Multiple attempts to IV team to restart. Has IV ABT that was due at 0600. Reported to oncoming nurse to hang once IV is restarted.

## 2011-12-26 ENCOUNTER — Encounter (HOSPITAL_COMMUNITY): Payer: Self-pay | Admitting: Orthopedic Surgery

## 2012-04-05 ENCOUNTER — Ambulatory Visit: Payer: No Typology Code available for payment source

## 2012-04-21 ENCOUNTER — Ambulatory Visit: Payer: Self-pay | Attending: Orthopedic Surgery | Admitting: *Deleted

## 2012-05-12 ENCOUNTER — Ambulatory Visit: Payer: Self-pay | Attending: Orthopedic Surgery | Admitting: Occupational Therapy

## 2012-05-12 DIAGNOSIS — R279 Unspecified lack of coordination: Secondary | ICD-10-CM | POA: Insufficient documentation

## 2012-05-12 DIAGNOSIS — IMO0001 Reserved for inherently not codable concepts without codable children: Secondary | ICD-10-CM | POA: Insufficient documentation

## 2012-05-12 DIAGNOSIS — M2550 Pain in unspecified joint: Secondary | ICD-10-CM | POA: Insufficient documentation

## 2012-05-12 DIAGNOSIS — S42309S Unspecified fracture of shaft of humerus, unspecified arm, sequela: Secondary | ICD-10-CM | POA: Insufficient documentation

## 2012-05-12 DIAGNOSIS — M6281 Muscle weakness (generalized): Secondary | ICD-10-CM | POA: Insufficient documentation

## 2012-05-12 DIAGNOSIS — M2560 Stiffness of unspecified joint, not elsewhere classified: Secondary | ICD-10-CM | POA: Insufficient documentation

## 2012-05-19 ENCOUNTER — Ambulatory Visit: Payer: Self-pay | Admitting: Occupational Therapy

## 2012-05-20 ENCOUNTER — Encounter: Payer: Self-pay | Admitting: Occupational Therapy

## 2012-05-25 ENCOUNTER — Ambulatory Visit: Payer: Self-pay | Admitting: Occupational Therapy

## 2012-06-07 ENCOUNTER — Ambulatory Visit: Payer: Self-pay | Admitting: Occupational Therapy

## 2012-06-10 ENCOUNTER — Ambulatory Visit: Payer: Self-pay | Attending: Orthopedic Surgery | Admitting: Occupational Therapy

## 2012-06-10 DIAGNOSIS — IMO0001 Reserved for inherently not codable concepts without codable children: Secondary | ICD-10-CM | POA: Insufficient documentation

## 2012-06-10 DIAGNOSIS — S42309S Unspecified fracture of shaft of humerus, unspecified arm, sequela: Secondary | ICD-10-CM | POA: Insufficient documentation

## 2012-06-10 DIAGNOSIS — M2560 Stiffness of unspecified joint, not elsewhere classified: Secondary | ICD-10-CM | POA: Insufficient documentation

## 2012-06-10 DIAGNOSIS — M2550 Pain in unspecified joint: Secondary | ICD-10-CM | POA: Insufficient documentation

## 2012-06-10 DIAGNOSIS — R279 Unspecified lack of coordination: Secondary | ICD-10-CM | POA: Insufficient documentation

## 2012-06-10 DIAGNOSIS — M6281 Muscle weakness (generalized): Secondary | ICD-10-CM | POA: Insufficient documentation

## 2012-06-14 ENCOUNTER — Ambulatory Visit: Payer: Self-pay | Admitting: Occupational Therapy

## 2012-06-16 ENCOUNTER — Ambulatory Visit: Payer: Self-pay | Admitting: Occupational Therapy

## 2012-06-22 ENCOUNTER — Ambulatory Visit: Payer: Self-pay | Admitting: Occupational Therapy

## 2012-06-24 ENCOUNTER — Ambulatory Visit: Payer: Self-pay | Admitting: Occupational Therapy

## 2012-06-29 ENCOUNTER — Ambulatory Visit: Payer: Self-pay | Admitting: Occupational Therapy

## 2012-07-01 ENCOUNTER — Ambulatory Visit: Payer: Self-pay | Admitting: Occupational Therapy

## 2012-07-06 ENCOUNTER — Ambulatory Visit: Payer: Self-pay | Admitting: Occupational Therapy

## 2012-07-08 ENCOUNTER — Ambulatory Visit: Payer: Self-pay | Admitting: Occupational Therapy

## 2012-07-14 ENCOUNTER — Ambulatory Visit: Payer: Self-pay | Attending: Orthopedic Surgery | Admitting: Occupational Therapy

## 2012-07-14 DIAGNOSIS — M6281 Muscle weakness (generalized): Secondary | ICD-10-CM | POA: Insufficient documentation

## 2012-07-14 DIAGNOSIS — M2550 Pain in unspecified joint: Secondary | ICD-10-CM | POA: Insufficient documentation

## 2012-07-14 DIAGNOSIS — M2560 Stiffness of unspecified joint, not elsewhere classified: Secondary | ICD-10-CM | POA: Insufficient documentation

## 2012-07-14 DIAGNOSIS — IMO0001 Reserved for inherently not codable concepts without codable children: Secondary | ICD-10-CM | POA: Insufficient documentation

## 2012-07-14 DIAGNOSIS — R279 Unspecified lack of coordination: Secondary | ICD-10-CM | POA: Insufficient documentation

## 2012-07-14 DIAGNOSIS — S42309S Unspecified fracture of shaft of humerus, unspecified arm, sequela: Secondary | ICD-10-CM | POA: Insufficient documentation

## 2012-07-16 ENCOUNTER — Ambulatory Visit: Payer: Self-pay | Admitting: *Deleted

## 2012-07-20 ENCOUNTER — Ambulatory Visit: Payer: Self-pay | Admitting: Occupational Therapy

## 2012-07-22 ENCOUNTER — Ambulatory Visit: Payer: Self-pay | Admitting: Occupational Therapy

## 2012-07-28 ENCOUNTER — Ambulatory Visit: Payer: Self-pay | Admitting: Occupational Therapy

## 2012-07-30 ENCOUNTER — Ambulatory Visit: Payer: Self-pay | Admitting: Occupational Therapy

## 2012-08-02 ENCOUNTER — Ambulatory Visit: Payer: Self-pay | Admitting: Occupational Therapy

## 2012-08-04 ENCOUNTER — Ambulatory Visit: Payer: Self-pay | Admitting: Occupational Therapy

## 2012-08-13 ENCOUNTER — Ambulatory Visit: Payer: Self-pay | Admitting: Occupational Therapy

## 2012-08-18 ENCOUNTER — Ambulatory Visit: Payer: Self-pay | Attending: Orthopedic Surgery | Admitting: Occupational Therapy

## 2012-08-18 DIAGNOSIS — M2560 Stiffness of unspecified joint, not elsewhere classified: Secondary | ICD-10-CM | POA: Insufficient documentation

## 2012-08-18 DIAGNOSIS — M2550 Pain in unspecified joint: Secondary | ICD-10-CM | POA: Insufficient documentation

## 2012-08-18 DIAGNOSIS — IMO0001 Reserved for inherently not codable concepts without codable children: Secondary | ICD-10-CM | POA: Insufficient documentation

## 2012-08-18 DIAGNOSIS — M6281 Muscle weakness (generalized): Secondary | ICD-10-CM | POA: Insufficient documentation

## 2012-08-18 DIAGNOSIS — R279 Unspecified lack of coordination: Secondary | ICD-10-CM | POA: Insufficient documentation

## 2012-08-20 ENCOUNTER — Ambulatory Visit: Payer: Self-pay | Admitting: Occupational Therapy

## 2012-08-27 ENCOUNTER — Ambulatory Visit: Payer: Self-pay | Admitting: Occupational Therapy

## 2012-08-31 ENCOUNTER — Ambulatory Visit: Payer: Self-pay | Admitting: Occupational Therapy

## 2012-11-17 ENCOUNTER — Emergency Department (INDEPENDENT_AMBULATORY_CARE_PROVIDER_SITE_OTHER)
Admission: EM | Admit: 2012-11-17 | Discharge: 2012-11-17 | Disposition: A | Discharge status: Home / Self Care | Payer: Self-pay | Source: Home / Self Care

## 2012-11-17 ENCOUNTER — Encounter (HOSPITAL_COMMUNITY): Payer: Self-pay | Admitting: Emergency Medicine

## 2012-11-17 DIAGNOSIS — H1013 Acute atopic conjunctivitis, bilateral: Secondary | ICD-10-CM

## 2012-11-17 DIAGNOSIS — I10 Essential (primary) hypertension: Secondary | ICD-10-CM

## 2012-11-17 DIAGNOSIS — H1045 Other chronic allergic conjunctivitis: Secondary | ICD-10-CM

## 2012-11-17 MED ORDER — HYDROCHLOROTHIAZIDE 25 MG PO TABS: 25.0000 mg | ORAL_TABLET | Freq: Every day | ORAL | Status: DC

## 2012-11-17 MED ORDER — KETOTIFEN FUMARATE 0.025 % OP SOLN: 1.0000 [drp] | Freq: Three times a day (TID) | OPHTHALMIC | Status: DC

## 2012-11-17 MED ORDER — ALBUTEROL SULFATE HFA 108 (90 BASE) MCG/ACT IN AERS: 2.0000 | INHALATION_SPRAY | Freq: Four times a day (QID) | RESPIRATORY_TRACT | Status: DC | PRN

## 2012-11-17 MED ORDER — LISINOPRIL 20 MG PO TABS: 20.0000 mg | ORAL_TABLET | Freq: Every day | ORAL | Status: DC

## 2012-11-17 MED ORDER — REFRESH P.M. OP OINT: TOPICAL_OINTMENT | Freq: Three times a day (TID) | OPHTHALMIC | Status: DC

## 2012-11-17 NOTE — ED Provider Notes (Signed)
History     CSN: [id number]  Arrival date & time 11/17/12  1046   First MD Initiated Contact with Patient 11/17/12 1058      Chief Complaint  Patient presents with  . Eye Drainage    (Consider location/radiation/quality/duration/timing/severity/associated sxs/prior treatment) HPI This is a 32 year old male who presents with redness and itching of his eyes which started last night. This morning he noted both his eyes were red and swollen. There was mild crusting on his eyelids but no significant discharge. No significant pain or change in vision or photophobia is noted. Patient is also here because of high blood pressure and lack of medication. He was previously taking lisinopril and HCTZ. He does not have insurance and therefore tends not to followup with his family Dr. and has run out of medication. Addition he is discussing and asking advice on how to lose weight. Past Medical History  Diagnosis Date  . Asthma     as child  . Obesity   . Asthma   . Hypertension     Past Surgical History  Procedure Laterality Date  . Orif humerus fracture  12/24/2011    Procedure: OPEN REDUCTION INTERNAL FIXATION (ORIF) HUMERAL SHAFT FRACTURE;  Surgeon: Kennieth Rad, MD;  Location: [facility] OR;  Service: Orthopedics;  Laterality: Right;    No family history on file.  History  Substance Use Topics  . Smoking status: Never Smoker   . Smokeless tobacco: Not on file  . Alcohol Use: Yes     Comment: Socially       Review of Systems  Constitutional: Negative.   HENT: Negative.   Eyes: Positive for redness and itching. Negative for photophobia, pain, discharge and visual disturbance.  Respiratory: Negative.   Cardiovascular: Negative.   Gastrointestinal: Negative.   Genitourinary: Negative.   Skin: Negative.   Neurological: Negative.   Hematological: Negative.   Psychiatric/Behavioral: Negative.     Allergies  Review of patient's allergies indicates no known allergies.  Home  Medications   Current Outpatient Rx  Name  Route  Sig  Dispense  Refill  . albuterol (PROVENTIL HFA;VENTOLIN HFA) 108 (90 BASE) MCG/ACT inhaler   Inhalation   Inhale 2 puffs into the lungs every 6 (six) hours as needed for wheezing. For shortness of breath   1 Inhaler   3   . Artificial Tear Ointment (ARTIFICIAL TEARS) ointment   Both Eyes   Place into both eyes 3 (three) times daily.   3.5 g   12   . hydrochlorothiazide (HYDRODIURIL) 25 MG tablet   Oral   Take 1 tablet (25 mg total) by mouth daily.   30 tablet   0   . ketotifen (ZADITOR) 0.025 % ophthalmic solution   Both Eyes   Place 1 drop into both eyes 3 (three) times daily.   5 mL   0   . lisinopril (PRINIVIL,ZESTRIL) 20 MG tablet   Oral   Take 1 tablet (20 mg total) by mouth daily.   30 tablet   0     BP 183/137  Pulse 104  Temp(Src) 98.3 F (36.8 C) (Oral)  Resp 22  SpO2 94%  Physical Exam  Constitutional: He is oriented to person, place, and time. He appears well-developed and well-nourished.  Morbidly obese  HENT:  Head: Normocephalic and atraumatic.  Eyes: Conjunctivae are normal. Pupils are equal, round, and reactive to light.  Neck: Normal range of motion. Neck supple.  Cardiovascular: Normal rate and regular rhythm.  Pulmonary/Chest: Effort normal and breath sounds normal.  Abdominal: Soft. Bowel sounds are normal.  Musculoskeletal: Normal range of motion.  Neurological: He is alert and oriented to person, place, and time.  Skin: Skin is warm and dry.  Psychiatric: He has a normal mood and affect. His behavior is normal.    ED Course  Procedures (including critical care time)  Labs Reviewed - No data to display No results found.   1. Allergic conjunctivitis, bilateral   2. HTN (hypertension)   3. Morbid obesity, BMI unknown       MDM  Ketotifen drops along with lubricant drops have been prescribed for her allergic conjunctivitis. Previous blood pressure medications have been  re-prescribed Extensive discussion regarding weight loss was had. Follow up with [facility] in one month        Calvert Cantor, MD 11/17/12 1130

## 2012-11-17 NOTE — ED Notes (Signed)
Pt c/o bilateral eye itchiness, redness onset yest Also c/o HTN... Reports he wakes up w/slight headaches every morning Denies: SOB, CP, blurry vision, edema Doesn't remember the name of BP meds... He is alert and oriented w/no signs of acute distress.

## 2014-04-14 ENCOUNTER — Encounter (HOSPITAL_COMMUNITY): Payer: Self-pay | Admitting: Emergency Medicine

## 2014-04-14 ENCOUNTER — Emergency Department (HOSPITAL_COMMUNITY)
Admission: EM | Admit: 2014-04-14 | Discharge: 2014-04-15 | Disposition: A | Discharge status: Home / Self Care | Payer: Self-pay | Attending: Emergency Medicine | Admitting: Emergency Medicine

## 2014-04-14 DIAGNOSIS — R0981 Nasal congestion: Secondary | ICD-10-CM | POA: Insufficient documentation

## 2014-04-14 DIAGNOSIS — I10 Essential (primary) hypertension: Secondary | ICD-10-CM | POA: Insufficient documentation

## 2014-04-14 DIAGNOSIS — Z79899 Other long term (current) drug therapy: Secondary | ICD-10-CM | POA: Insufficient documentation

## 2014-04-14 DIAGNOSIS — J45909 Unspecified asthma, uncomplicated: Secondary | ICD-10-CM | POA: Insufficient documentation

## 2014-04-14 MED ORDER — HYDROCHLOROTHIAZIDE 25 MG PO TABS
25.0000 mg | ORAL_TABLET | Freq: Once | ORAL | Status: AC
  Administered 2014-04-14: 25 mg via ORAL
  Filled 2014-04-14: qty 1

## 2014-04-14 MED ORDER — LISINOPRIL 20 MG PO TABS
20.0000 mg | ORAL_TABLET | Freq: Once | ORAL | Status: AC
  Administered 2014-04-14: 20 mg via ORAL
  Filled 2014-04-14: qty 1

## 2014-04-14 NOTE — ED Notes (Signed)
The patient said he has high blood pressure and is out of medication.  He is not complaining of any other symptoms other than epistaxis.

## 2014-04-14 NOTE — ED Notes (Signed)
Pt. reports epistaxis at left nare onset this morning , no bleeding at triage , respirations unlabored .

## 2014-04-15 LAB — BASIC METABOLIC PANEL
ANION GAP: 12 (ref 5–15)
BUN: 19 mg/dL (ref 6–23)
CALCIUM: 9.3 mg/dL (ref 8.4–10.5)
CO2: 30 mEq/L (ref 19–32)
Chloride: 96 mEq/L (ref 96–112)
Creatinine, Ser: 1.18 mg/dL (ref 0.50–1.35)
GFR calc Af Amer: >90 mL/min (ref >90)
GFR, EST NON AFRICAN AMERICAN: 82 mL/min — AB (ref >90)
GLUCOSE: 132 mg/dL — AB (ref 70–99)
Potassium: 3.6 mEq/L — ABNORMAL LOW (ref 3.7–5.3)
SODIUM: 138 meq/L (ref 137–147)

## 2014-04-15 LAB — CBC WITH DIFFERENTIAL/PLATELET
BASOS ABS: 0 10*3/uL (ref 0.0–0.1)
BASOS PCT: 0 % (ref 0–1)
EOS PCT: 2 % (ref 0–5)
Eosinophils Absolute: 0.3 10*3/uL (ref 0.0–0.7)
HCT: 42.9 % (ref 39.0–52.0)
Hemoglobin: 14.1 g/dL (ref 13.0–17.0)
LYMPHS PCT: 25 % (ref 12–46)
Lymphs Abs: 3.2 10*3/uL (ref 0.7–4.0)
MCH: 28.4 pg (ref 26.0–34.0)
MCHC: 32.9 g/dL (ref 30.0–36.0)
MCV: 86.5 fL (ref 78.0–100.0)
Monocytes Absolute: 0.6 10*3/uL (ref 0.1–1.0)
Monocytes Relative: 5 % (ref 3–12)
Neutro Abs: 8.9 10*3/uL — ABNORMAL HIGH (ref 1.7–7.7)
Neutrophils Relative %: 68 % (ref 43–77)
PLATELETS: 285 10*3/uL (ref 150–400)
RBC: 4.96 MIL/uL (ref 4.22–5.81)
RDW: 14.7 % (ref 11.5–15.5)
WBC: 13 10*3/uL — AB (ref 4.0–10.5)

## 2014-04-15 MED ORDER — LABETALOL HCL 5 MG/ML IV SOLN
20.0000 mg | INTRAVENOUS | Status: DC | PRN
  Administered 2014-04-15: 20 mg via INTRAVENOUS
  Filled 2014-04-15: qty 4

## 2014-04-15 MED ORDER — SODIUM CHLORIDE 0.9 % IV SOLN
INTRAVENOUS | Status: DC
  Administered 2014-04-15: 01:00:00 via INTRAVENOUS

## 2014-04-15 MED ORDER — LISINOPRIL 20 MG PO TABS: 20.0000 mg | ORAL_TABLET | Freq: Every day | ORAL | Status: DC

## 2014-04-15 MED ORDER — HYDROCHLOROTHIAZIDE 25 MG PO TABS: 25.0000 mg | ORAL_TABLET | Freq: Every day | ORAL | Status: DC

## 2014-04-15 NOTE — ED Notes (Signed)
Dr. Effie ShyWentz at the bedside to obtain manual BP: 232/144.

## 2014-04-15 NOTE — ED Provider Notes (Signed)
Pt here with epistaxis that has stopped.  Pt with severe HTN, has been out of medications for some time.  Awaiting labs, labetalol.  Pt is asymptomatic at this time.  If labs normal and bp improves with labetalol ok for d/c.  Pt has received his old home medication  Results for orders placed or performed during the hospital encounter of 04/14/14  Basic metabolic panel  Result Value Ref Range   Sodium 138 137 - 147 mEq/L   Potassium 3.6 (L) 3.7 - 5.3 mEq/L   Chloride 96 96 - 112 mEq/L   CO2 30 19 - 32 mEq/L   Glucose, Bld 132 (H) 70 - 99 mg/dL   BUN 19 6 - 23 mg/dL   Creatinine, Ser 1.611.18 0.50 - 1.35 mg/dL   Calcium 9.3 8.4 - 09.610.5 mg/dL   GFR calc non Af Amer 82 (L) >90 mL/min   GFR calc Af Amer >90 >90 mL/min   Anion gap 12 5 - 15  CBC with Differential  Result Value Ref Range   WBC 13.0 (H) 4.0 - 10.5 K/uL   RBC 4.96 4.22 - 5.81 MIL/uL   Hemoglobin 14.1 13.0 - 17.0 g/dL   HCT 04.542.9 40.939.0 - 81.152.0 %   MCV 86.5 78.0 - 100.0 fL   MCH 28.4 26.0 - 34.0 pg   MCHC 32.9 30.0 - 36.0 g/dL   RDW 91.414.7 78.211.5 - 95.615.5 %   Platelets 285 150 - 400 K/uL   Neutrophils Relative % 68 43 - 77 %   Neutro Abs 8.9 (H) 1.7 - 7.7 K/uL   Lymphocytes Relative 25 12 - 46 %   Lymphs Abs 3.2 0.7 - 4.0 K/uL   Monocytes Relative 5 3 - 12 %   Monocytes Absolute 0.6 0.1 - 1.0 K/uL   Eosinophils Relative 2 0 - 5 %   Eosinophils Absolute 0.3 0.0 - 0.7 K/uL   Basophils Relative 0 0 - 1 %   Basophils Absolute 0.0 0.0 - 0.1 K/uL   No results found.   Olivia Mackielga M Governor Matos, MD 04/15/14 [phone]

## 2014-04-15 NOTE — ED Provider Notes (Signed)
CSN: [id number]     Arrival date & time 04/14/14  2054 History   First MD Initiated Contact with Patient 04/14/14 2321     Chief Complaint  Patient presents with  . Epistaxis     (Consider location/radiation/quality/duration/timing/severity/associated sxs/prior Treatment) HPI   Justin Brown is a 33 y.o. maleWho presents for evaluation of epistaxis.  He has been having intermittent bleeding from the left nares for 2 days.  He has had episodes of bleeding between one and 2 hours each. He stopped his blood pressure medications, about 3 months ago, because he ran out.he denies headache, nasal congestion, sinus pain, neck pain, back pain, weakness, dizziness, nausea or vomiting.  He does not currently have a primary care doctor.  There are no other known modifying factors.   Past Medical History  Diagnosis Date  . Asthma     as child  . Obesity   . Asthma   . Hypertension   . Obesity    Past Surgical History  Procedure Laterality Date  . Orif humerus fracture  12/24/2011    Procedure: OPEN REDUCTION INTERNAL FIXATION (ORIF) HUMERAL SHAFT FRACTURE;  Surgeon: Kennieth RadArthur F Carter, MD;  [facility];  Service: Orthopedics;  Laterality: Right;   No family history on file. History  Substance Use Topics  . Smoking status: Never Smoker   . Smokeless tobacco: Not on file  . Alcohol Use: Yes     Comment: Socially     Review of Systems  All other systems reviewed and are negative.     Allergies  Review of patient's allergies indicates no known allergies.  Home Medications   Prior to Admission medications   Medication Sig Start Date End Date Taking? Authorizing Provider  albuterol (PROVENTIL HFA;VENTOLIN HFA) 108 (90 BASE) MCG/ACT inhaler Inhale 2 puffs into the lungs every 6 (six) hours as needed for wheezing. For shortness of breath 11/17/12   Calvert CantorSaima Rizwan, MD  Artificial Tear Ointment (ARTIFICIAL TEARS) ointment Place into both eyes 3 (three) times daily. 11/17/12   Calvert CantorSaima  Rizwan, MD  hydrochlorothiazide (HYDRODIURIL) 25 MG tablet Take 1 tablet (25 mg total) by mouth daily. 04/15/14   Olivia Mackielga M Otter, MD  ketotifen (ZADITOR) 0.025 % ophthalmic solution Place 1 drop into both eyes 3 (three) times daily. 11/17/12   Calvert CantorSaima Rizwan, MD  lisinopril (PRINIVIL,ZESTRIL) 20 MG tablet Take 1 tablet (20 mg total) by mouth daily. 04/15/14   Olivia Mackielga M Otter, MD   BP 176/96 mmHg  Pulse 87  Temp(Src) 98.7 F (37.1 C)  Resp 16  Wt 383 lb (173.728 kg)  SpO2 90% Physical Exam  Constitutional: He is oriented to person, place, and time. He appears well-developed.  Morbid obesity  HENT:  Head: Normocephalic and atraumatic.  Right Ear: External ear normal.  Left Ear: External ear normal.  There is no bleeding in either nares.  There is no site of recent bleeding in the left nares.  There is mild congestion of the left nares mucosal tissue.  Eyes: Conjunctivae and EOM are normal. Pupils are equal, round, and reactive to light.  Neck: Normal range of motion and phonation normal. Neck supple.  Cardiovascular: Normal rate, regular rhythm and normal heart sounds.   Hypertension is present  Pulmonary/Chest: Effort normal and breath sounds normal. He exhibits no bony tenderness.  Musculoskeletal: Normal range of motion.  Neurological: He is alert and oriented to person, place, and time. No cranial nerve deficit or sensory deficit. He exhibits normal muscle tone. Coordination  normal.  Skin: Skin is warm, dry and intact.  Psychiatric: He has a normal mood and affect. His behavior is normal. Judgment and thought content normal.  Nursing note and vitals reviewed.   ED Course  Procedures (including critical care time)  Medications  lisinopril (PRINIVIL,ZESTRIL) tablet 20 mg (20 mg Oral Given 04/14/14 2348)  hydrochlorothiazide (HYDRODIURIL) tablet 25 mg (25 mg Oral Given 04/14/14 2348)    No data found.   Manual blood pressure assessment, by me, at 00:30- left arm, 260/160; right arm  232/144   00:35-Patient with significant hypertension, but no clinical evidence for hypertensive urgency. Will obtain screening labs, and began intravenous lowering of blood pressure.   Labs Review Labs Reviewed  BASIC METABOLIC PANEL - Abnormal; Notable for the following:    Potassium 3.6 (*)    Glucose, Bld 132 (*)    GFR calc non Af Amer 82 (*)    All other components within normal limits  CBC WITH DIFFERENTIAL - Abnormal; Notable for the following:    WBC 13.0 (*)    Neutro Abs 8.9 (*)    All other components within normal limits    Imaging Review No results found.   EKG Interpretation None      MDM   Final diagnoses:  Essential hypertension    Nursing Notes Reviewed/ Care Coordinated, and agree without changes. Applicable Imaging Reviewed.  Interpretation of Laboratory Data incorporated into ED treatment   Care to oncoming provider team to evaluate patient after return of labs and treatment with intravenous medications.    Flint MelterElliott L Keelon Zurn, MD 04/17/14 [phone]

## 2014-04-15 NOTE — Discharge Instructions (Signed)
Your blood pressure today in the emergency department was very elevated, and this can have life threatening effects.  It is very important for you to restart your blood pressure medications.  Please use the list below to establish a local primary care provider.   DASH Eating Plan DASH stands for "Dietary Approaches to Stop Hypertension." The DASH eating plan is a healthy eating plan that has been shown to reduce high blood pressure (hypertension). Additional health benefits may include reducing the risk of type 2 diabetes mellitus, heart disease, and stroke. The DASH eating plan may also help with weight loss. WHAT DO I NEED TO KNOW ABOUT THE DASH EATING PLAN? For the DASH eating plan, you will follow these general guidelines:  Choose foods with a percent daily value for sodium of less than 5% (as listed on the food label).  Use salt-free seasonings or herbs instead of table salt or sea salt.  Check with your health care provider or pharmacist before using salt substitutes.  Eat lower-sodium products, often labeled as "lower sodium" or "no salt added."  Eat fresh foods.  Eat more vegetables, fruits, and low-fat dairy products.  Choose whole grains. Look for the word "whole" as the first word in the ingredient list.  Choose fish and skinless chicken or [place] more often than red meat. Limit fish, poultry, and meat to 6 oz (170 g) each day.  Limit sweets, desserts, sugars, and sugary drinks.  Choose heart-healthy fats.  Limit cheese to 1 oz (28 g) per day.  Eat more home-cooked food and less restaurant, buffet, and fast food.  Limit fried foods.  Cook foods using methods other than frying.  Limit canned vegetables. If you do use them, rinse them well to decrease the sodium.  When eating at a restaurant, ask that your food be prepared with less salt, or no salt if possible. WHAT FOODS CAN I EAT? Seek help from a dietitian for individual calorie needs. Grains Whole grain or  whole wheat bread. Brown rice. Whole grain or whole wheat pasta. Quinoa, bulgur, and whole grain cereals. Low-sodium cereals. Corn or whole wheat flour tortillas. Whole grain cornbread. Whole grain crackers. Low-sodium crackers. Vegetables Fresh or frozen vegetables (raw, steamed, roasted, or grilled). Low-sodium or reduced-sodium tomato and vegetable juices. Low-sodium or reduced-sodium tomato sauce and paste. Low-sodium or reduced-sodium canned vegetables.  Fruits All fresh, canned (in natural juice), or frozen fruits. Meat and Other Protein Products Ground beef (85% or leaner), grass-fed beef, or beef trimmed of fat. Skinless chicken or [place]. Ground chicken or [place]key. Pork trimmed of fat. All fish and seafood. Eggs. Dried beans, peas, or lentils. Unsalted nuts and seeds. Unsalted canned beans. Dairy Low-fat dairy products, such as skim or 1% milk, 2% or reduced-fat cheeses, low-fat ricotta or cottage cheese, or plain low-fat yogurt. Low-sodium or reduced-sodium cheeses. Fats and Oils Tub margarines without trans fats. Light or reduced-fat mayonnaise and salad dressings (reduced sodium). Avocado. Safflower, olive, or canola oils. Natural peanut or almond butter. Other Unsalted popcorn and pretzels. The items listed above may not be a complete list of recommended foods or beverages. Contact your dietitian for more options. WHAT FOODS ARE NOT RECOMMENDED? Grains White bread. White pasta. White rice. Refined cornbread. Bagels and croissants. Crackers that contain trans fat. Vegetables Creamed or fried vegetables. Vegetables in a cheese sauce. Regular canned vegetables. Regular canned tomato sauce and paste. Regular tomato and vegetable juices. Fruits Dried fruits. Canned fruit in light or heavy syrup. Fruit juice. Meat and  Other Protein Products Fatty cuts of meat. Ribs, chicken wings, bacon, sausage, bologna, salami, chitterlings, fatback, hot dogs, bratwurst, and packaged luncheon meats.  Salted nuts and seeds. Canned beans with salt. Dairy Whole or 2% milk, cream, half-and-half, and cream cheese. Whole-fat or sweetened yogurt. Full-fat cheeses or blue cheese. Nondairy creamers and whipped toppings. Processed cheese, cheese spreads, or cheese curds. Condiments Onion and garlic salt, seasoned salt, table salt, and sea salt. Canned and packaged gravies. Worcestershire sauce. Tartar sauce. Barbecue sauce. Teriyaki sauce. Soy sauce, including reduced sodium. Steak sauce. Fish sauce. Oyster sauce. Cocktail sauce. Horseradish. Ketchup and mustard. Meat flavorings and tenderizers. Bouillon cubes. Hot sauce. Tabasco sauce. Marinades. Taco seasonings. Relishes. Fats and Oils Butter, stick margarine, lard, shortening, ghee, and bacon fat. Coconut, palm kernel, or palm oils. Regular salad dressings. Other Pickles and olives. Salted popcorn and pretzels. The items listed above may not be a complete list of foods and beverages to avoid. Contact your dietitian for more information. WHERE CAN I FIND MORE INFORMATION? National Heart, Lung, and Blood Institute: [employer] Document Released: 05/15/2011 Document Revised: 10/10/2013 Document Reviewed: 03/30/2013 [facility] Patient Information 2015 [place], [place]. This information is not intended to replace advice given to you by your health care provider. Make sure you discuss any questions you have with your health care provider.  How to Take Your Blood Pressure HOW DO I GET A BLOOD PRESSURE MACHINE?  You can buy an electronic home blood pressure machine at your local pharmacy. Insurance will sometimes cover the cost if you have a prescription.  Ask your doctor what type of machine is best for you. There are different machines for your arm and your wrist.  If you decide to buy a machine to check your blood pressure on your arm, first check the size of your arm so you can buy the right size cuff. To check the  size of your arm:   Use a measuring tape that shows both inches and centimeters.   Wrap the measuring tape around the upper-middle part of your arm. You may need someone to help you measure.   Write down your arm measurement in both inches and centimeters.   To measure your blood pressure correctly, it is important to have the right size cuff.   If your arm is up to 13 inches (up to 34 centimeters), get an adult cuff size.  If your arm is 13 to 17 inches (35 to 44 centimeters), get a large adult cuff size.    If your arm is 17 to 20 inches (45 to 52 centimeters), get an adult thigh cuff.  WHAT DO THE NUMBERS MEAN?   There are two numbers that make up your blood pressure. For example: 120/80.  The first number (120 in our example) is called the "systolic pressure." It is a measure of the pressure in your blood vessels when your heart is pumping blood.  The second number (80 in our example) is called the "diastolic pressure." It is a measure of the pressure in your blood vessels when your heart is resting between beats.  Your doctor will tell you what your blood pressure should be. WHAT SHOULD I DO BEFORE I CHECK MY BLOOD PRESSURE?   Try to rest or relax for at least 30 minutes before you check your blood pressure.  Do not smoke.  Do not have any drinks with caffeine, such as:  Soda.  Coffee.  Tea.  Check your blood pressure in a quiet room.  Sit  down and stretch out your arm on a table. Keep your arm at about the level of your heart. Let your arm relax.  Make sure that your legs are not crossed. HOW DO I CHECK MY BLOOD PRESSURE?  Follow the directions that came with your machine.  Make sure you remove any tight-fighting clothing from your arm or wrist. Wrap the cuff around your upper arm or wrist. You should be able to fit a finger between the cuff and your arm. If you cannot fit a finger between the cuff and your arm, it is too tight and should be removed and  rewrapped.  Some units require you to manually pump up the arm cuff.  Automatic units inflate the cuff when you press a button.  Cuff deflation is automatic in both models.  After the cuff is inflated, the unit measures your blood pressure and pulse. The readings are shown on a monitor. Hold still and breathe normally while the cuff is inflated.  Getting a reading takes less than a minute.  Some models store readings in a memory. Some provide a printout of readings. If your machine does not store your readings, keep a written record.  Take readings with you to your next visit with your doctor. Document Released: 05/08/2008 Document Revised: 10/10/2013 Document Reviewed: 07/21/2013 [facility] Patient Information 2015 [place], [place]. This information is not intended to replace advice given to you by your health care provider. Make sure you discuss any questions you have with your health care provider.  Hypertension Hypertension, commonly called high blood pressure, is when the force of blood pumping through your arteries is too strong. Your arteries are the blood vessels that carry blood from your heart throughout your body. A blood pressure reading consists of a higher number over a lower number, such as 110/72. The higher number (systolic) is the pressure inside your arteries when your heart pumps. The lower number (diastolic) is the pressure inside your arteries when your heart relaxes. Ideally you want your blood pressure below 120/80. Hypertension forces your heart to work harder to pump blood. Your arteries may become narrow or stiff. Having hypertension puts you at risk for heart disease, stroke, and other problems.  RISK FACTORS Some risk factors for high blood pressure are controllable. Others are not.  Risk factors you cannot control include:   Race. You may be at higher risk if you are African American.  Age. Risk increases with age.  Gender. Men are at higher risk than women  before age 15 years. After age 68, women are at higher risk than men. Risk factors you can control include:  Not getting enough exercise or physical activity.  Being overweight.  Getting too much fat, sugar, calories, or salt in your diet.  Drinking too much alcohol. SIGNS AND SYMPTOMS Hypertension does not usually cause signs or symptoms. Extremely high blood pressure (hypertensive crisis) may cause headache, anxiety, shortness of breath, and nosebleed. DIAGNOSIS  To check if you have hypertension, your health care provider will measure your blood pressure while you are seated, with your arm held at the level of your heart. It should be measured at least twice using the same arm. Certain conditions can cause a difference in blood pressure between your right and left arms. A blood pressure reading that is higher than normal on one occasion does not mean that you need treatment. If one blood pressure reading is high, ask your health care provider about having it checked again. TREATMENT  Treating  high blood pressure includes making lifestyle changes and possibly taking medicine. Living a healthy lifestyle can help lower high blood pressure. You may need to change some of your habits. Lifestyle changes may include:  Following the DASH diet. This diet is high in fruits, vegetables, and whole grains. It is low in salt, red meat, and added sugars.  Getting at least 2 hours of brisk physical activity every week.  Losing weight if necessary.  Not smoking.  Limiting alcoholic beverages.  Learning ways to reduce stress. If lifestyle changes are not enough to get your blood pressure under control, your health care provider may prescribe medicine. You may need to take more than one. Work closely with your health care provider to understand the risks and benefits. HOME CARE INSTRUCTIONS  Have your blood pressure rechecked as directed by your health care provider.   Take medicines only as  directed by your health care provider. Follow the directions carefully. Blood pressure medicines must be taken as prescribed. The medicine does not work as well when you skip doses. Skipping doses also puts you at risk for problems.   Do not smoke.   Monitor your blood pressure at home as directed by your health care provider. SEEK MEDICAL CARE IF:   You think you are having a reaction to medicines taken.  You have recurrent headaches or feel dizzy.  You have swelling in your ankles.  You have trouble with your vision. SEEK IMMEDIATE MEDICAL CARE IF:  You develop a severe headache or confusion.  You have unusual weakness, numbness, or feel faint.  You have severe chest or abdominal pain.  You vomit repeatedly.  You have trouble breathing. MAKE SURE YOU:   Understand these instructions.  Will watch your condition.  Will get help right away if you are not doing well or get worse. Document Released: 05/26/2005 Document Revised: 10/10/2013 Document Reviewed: 03/18/2013 [facility] Patient Information 2015 [place], [place]. This information is not intended to replace advice given to you by your health care provider. Make sure you discuss any questions you have with your health care provider.  Managing Your High Blood Pressure Blood pressure is a measurement of how forceful your blood is pressing against the walls of the arteries. Arteries are muscular tubes within the circulatory system. Blood pressure does not stay the same. Blood pressure rises when you are active, excited, or nervous; and it lowers during sleep and relaxation. If the numbers measuring your blood pressure stay above normal most of the time, you are at risk for health problems. High blood pressure (hypertension) is a long-term (chronic) condition in which blood pressure is elevated. A blood pressure reading is recorded as two numbers, such as 120 over 80 (or 120/80). The first, higher number is called the systolic  pressure. It is a measure of the pressure in your arteries as the heart beats. The second, lower number is called the diastolic pressure. It is a measure of the pressure in your arteries as the heart relaxes between beats.  Keeping your blood pressure in a normal range is important to your overall health and prevention of health problems, such as heart disease and stroke. When your blood pressure is uncontrolled, your heart has to work harder than normal. High blood pressure is a very common condition in adults because blood pressure tends to rise with age. Men and women are equally likely to have hypertension but at different times in life. Before age 31, men are more likely to have hypertension.  After 31 years of age, women are more likely to have it. Hypertension is especially common in African Americans. This condition often has no signs or symptoms. The cause of the condition is usually not known. Your caregiver can help you come up with a plan to keep your blood pressure in a normal, healthy range. BLOOD PRESSURE STAGES Blood pressure is classified into four stages: normal, prehypertension, stage 1, and stage 2. Your blood pressure reading will be used to determine what type of treatment, if any, is necessary. Appropriate treatment options are tied to these four stages:  Normal  Systolic pressure (mm Hg): below 120.  Diastolic pressure (mm Hg): below 80. Prehypertension  Systolic pressure (mm Hg): 120 to 139.  Diastolic pressure (mm Hg): 80 to 89. Stage1  Systolic pressure (mm Hg): 140 to 159.  Diastolic pressure (mm Hg): 90 to 99. Stage2  Systolic pressure (mm Hg): 160 or above.  Diastolic pressure (mm Hg): 100 or above. RISKS RELATED TO HIGH BLOOD PRESSURE Managing your blood pressure is an important responsibility. Uncontrolled high blood pressure can lead to:  A heart attack.  A stroke.  A weakened blood vessel (aneurysm).  Heart failure.  Kidney damage.  Eye  damage.  Metabolic syndrome.  Memory and concentration problems. HOW TO MANAGE YOUR BLOOD PRESSURE Blood pressure can be managed effectively with lifestyle changes and medicines (if needed). Your caregiver will help you come up with a plan to bring your blood pressure within a normal range. Your plan should include the following: Education  Read all information provided by your caregivers about how to control blood pressure.  Educate yourself on the latest guidelines and treatment recommendations. New research is always being done to further define the risks and treatments for high blood pressure. Lifestylechanges  Control your weight.  Avoid smoking.  Stay physically active.  Reduce the amount of salt in your diet.  Reduce stress.  Control any chronic conditions, such as high cholesterol or diabetes.  Reduce your alcohol intake. Medicines  Several medicines (antihypertensive medicines) are available, if needed, to bring blood pressure within a normal range. Communication  Review all the medicines you take with your caregiver because there may be side effects or interactions.  Talk with your caregiver about your diet, exercise habits, and other lifestyle factors that may be contributing to high blood pressure.  See your caregiver regularly. Your caregiver can help you create and adjust your plan for managing high blood pressure. RECOMMENDATIONS FOR TREATMENT AND FOLLOW-UP  The following recommendations are based on current guidelines for managing high blood pressure in nonpregnant adults. Use these recommendations to identify the proper follow-up period or treatment option based on your blood pressure reading. You can discuss these options with your caregiver.  Systolic pressure of 120 to 139 or diastolic pressure of 80 to 89: Follow up with your caregiver as directed.  Systolic pressure of 140 to 160 or diastolic pressure of 90 to 100: Follow up with your caregiver within  2 months.  Systolic pressure above 160 or diastolic pressure above 100: Follow up with your caregiver within 1 month.  Systolic pressure above 180 or diastolic pressure above 110: Consider antihypertensive therapy; follow up with your caregiver within 1 week.  Systolic pressure above 200 or diastolic pressure above 120: Begin antihypertensive therapy; follow up with your caregiver within 1 week. Document Released: 02/18/2012 Document Reviewed: 02/18/2012 [facility] Patient Information 2015 [place], [place]. This information is not intended to replace advice given to you by your  health care provider. Make sure you discuss any questions you have with your health care provider. ° °

## 2014-04-15 NOTE — ED Notes (Signed)
EDP at bedside  

## 2014-05-07 IMAGING — CR DG CHEST 2V
2 series · 2 of 2 positions shown · non-contrast
Comparison: 04/23/2011

CLINICAL DATA: Preop for right humeral surgery.

CHEST - 2 VIEW

[view not recorded (1 of 2)]
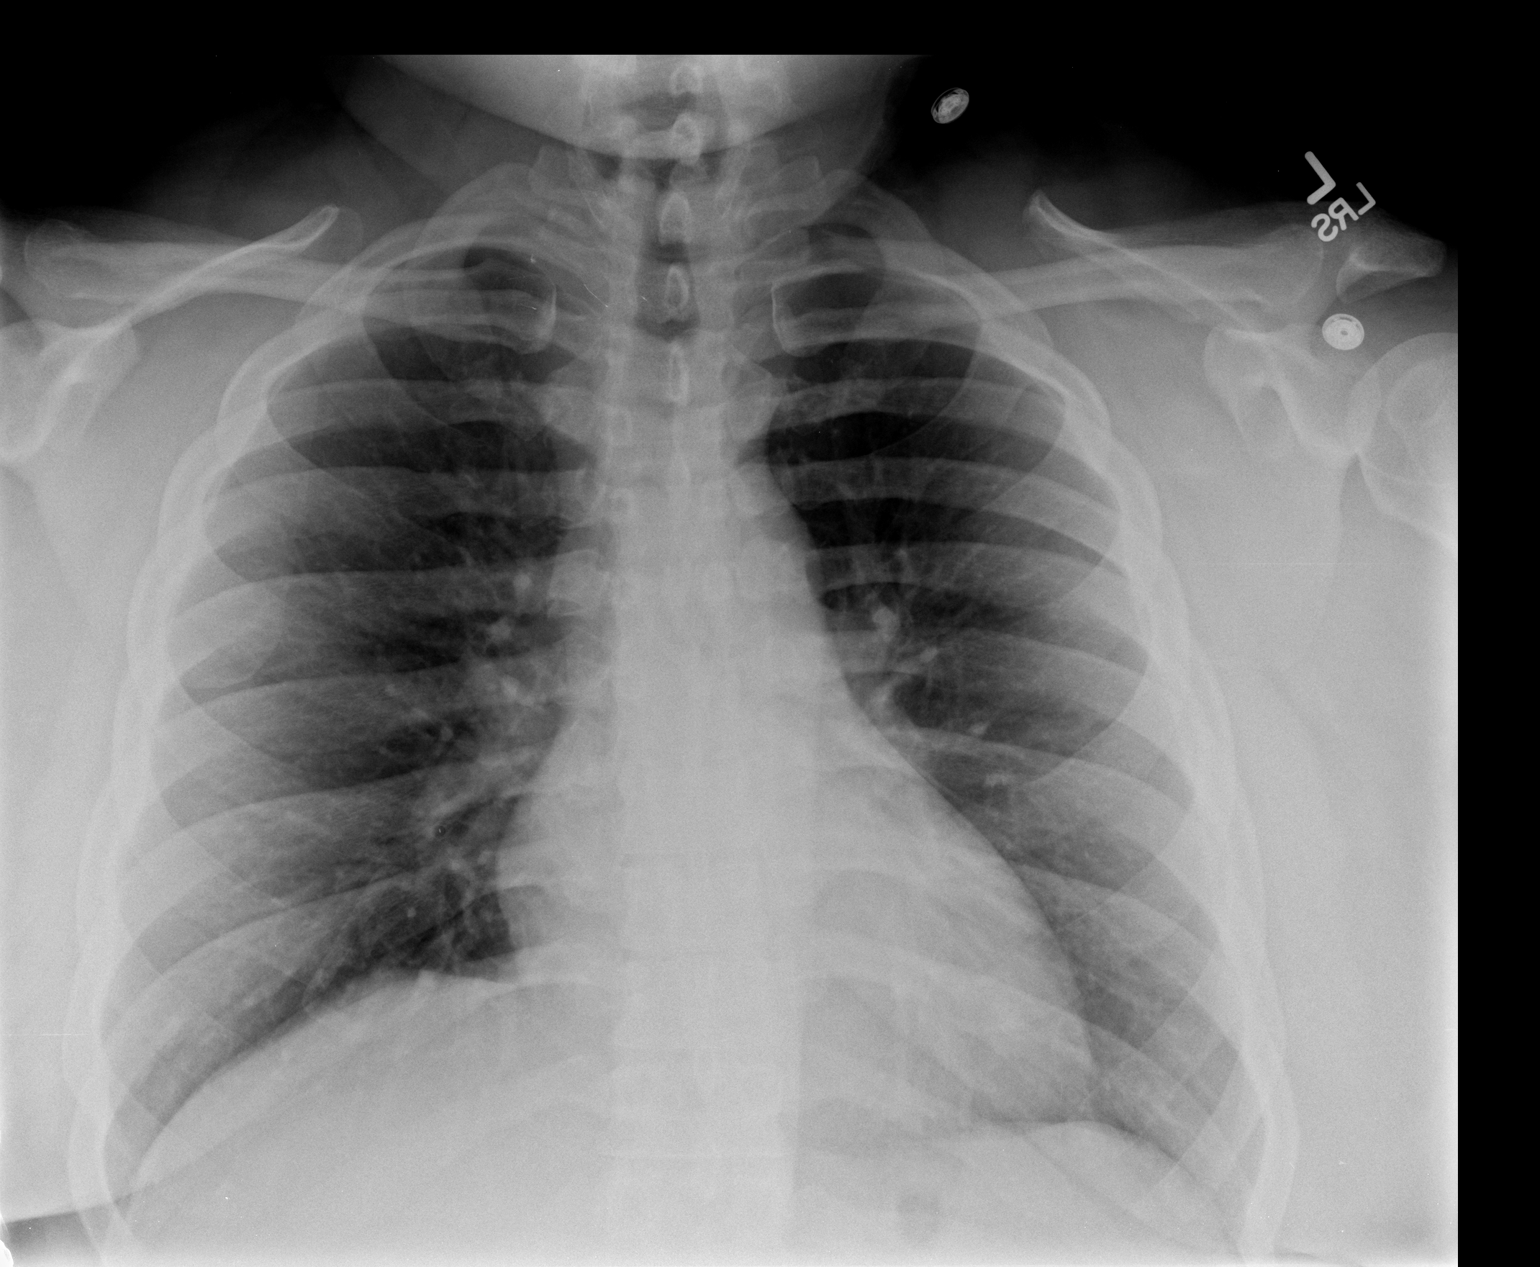

[view not recorded (2 of 2)]
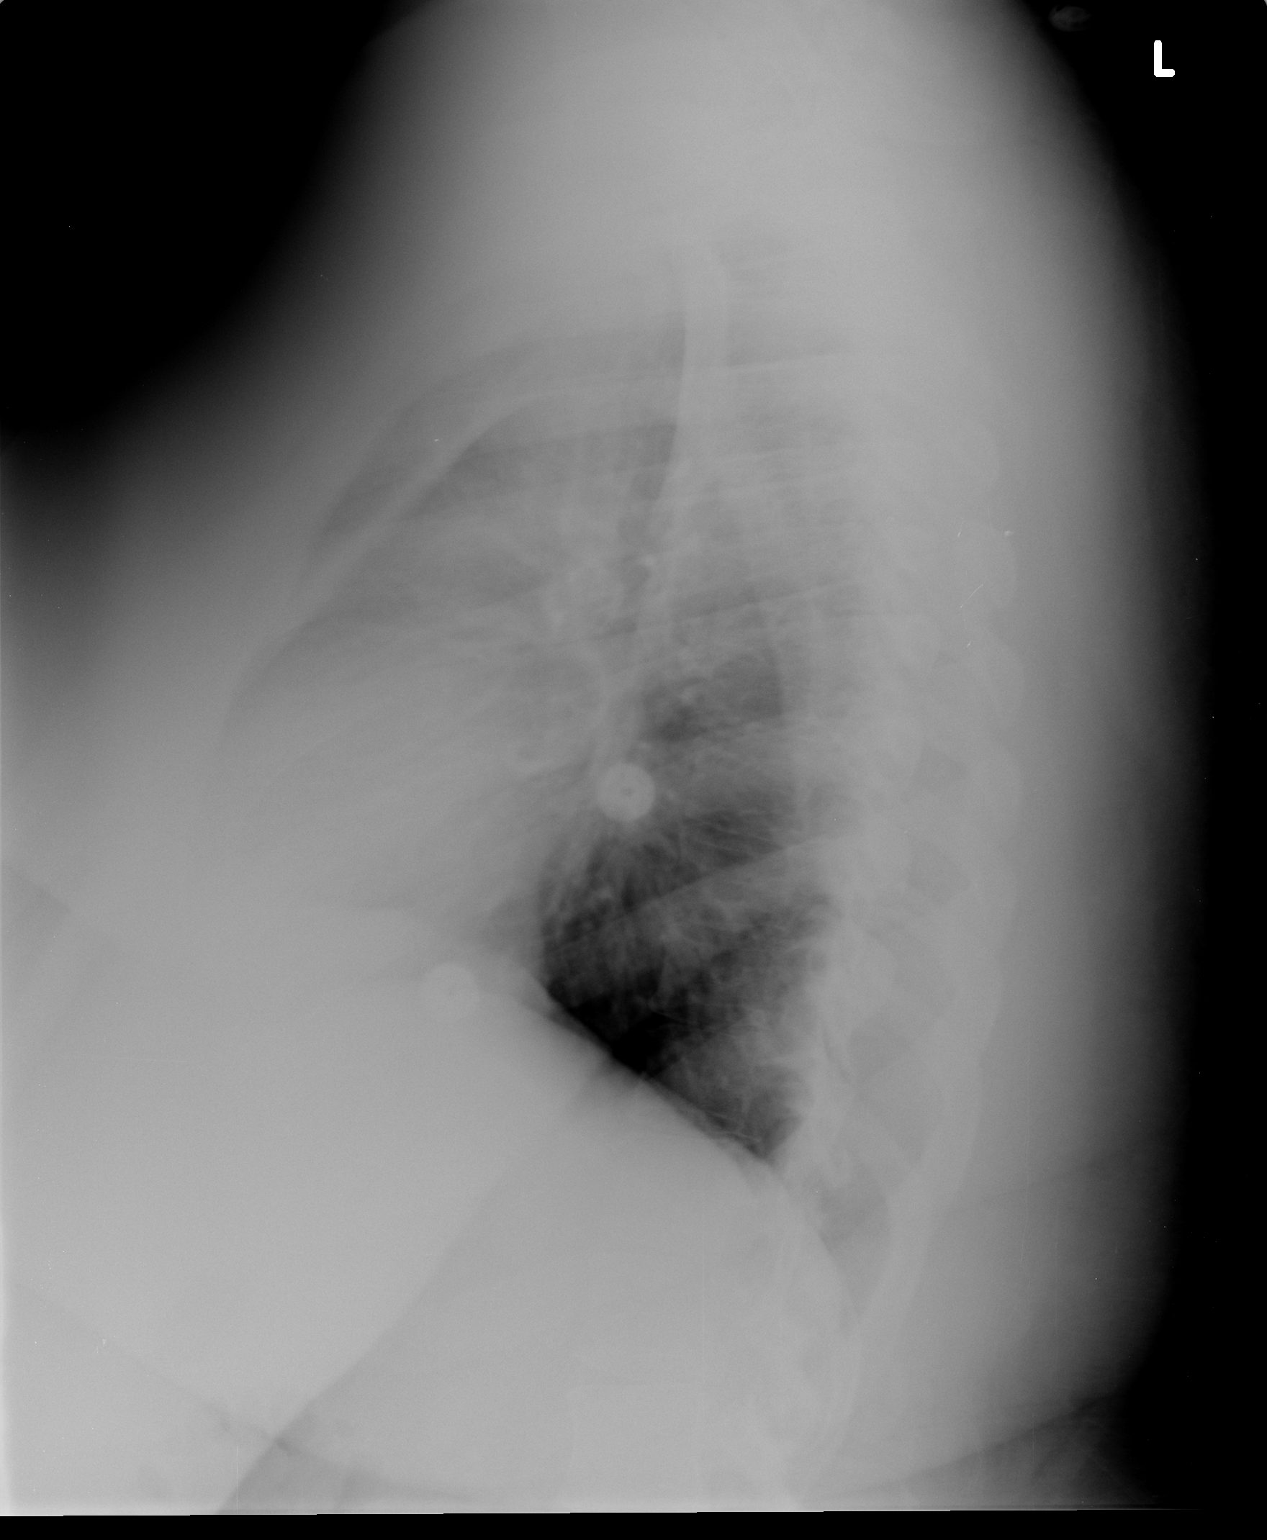

[2 of 2 positions shown; findings below may reference images not displayed]

FINDINGS: The heart size and pulmonary vascularity are normal. The
lungs appear clear and expanded without focal air space disease or
consolidation. No blunting of the costophrenic angles.  No
pneumothorax.  No significant change since previous study.
IMPRESSION: No evidence of active pulmonary disease.

## 2015-08-18 ENCOUNTER — Emergency Department (HOSPITAL_COMMUNITY): Payer: Self-pay

## 2015-08-18 ENCOUNTER — Observation Stay (HOSPITAL_COMMUNITY)
Admission: EM | Admit: 2015-08-18 | Discharge: 2015-08-19 | Disposition: A | Discharge status: Home / Self Care | Payer: Self-pay | Attending: Infectious Disease | Admitting: Infectious Disease

## 2015-08-18 ENCOUNTER — Encounter (HOSPITAL_COMMUNITY): Payer: Self-pay

## 2015-08-18 DIAGNOSIS — I509 Heart failure, unspecified: Secondary | ICD-10-CM

## 2015-08-18 DIAGNOSIS — Z833 Family history of diabetes mellitus: Secondary | ICD-10-CM | POA: Insufficient documentation

## 2015-08-18 DIAGNOSIS — I161 Hypertensive emergency: Secondary | ICD-10-CM

## 2015-08-18 DIAGNOSIS — Z9114 Patient's other noncompliance with medication regimen: Secondary | ICD-10-CM | POA: Insufficient documentation

## 2015-08-18 DIAGNOSIS — I10 Essential (primary) hypertension: Secondary | ICD-10-CM | POA: Diagnosis present

## 2015-08-18 DIAGNOSIS — I5021 Acute systolic (congestive) heart failure: Secondary | ICD-10-CM | POA: Insufficient documentation

## 2015-08-18 DIAGNOSIS — R7301 Impaired fasting glucose: Secondary | ICD-10-CM

## 2015-08-18 DIAGNOSIS — Z6841 Body Mass Index (BMI) 40.0 and over, adult: Secondary | ICD-10-CM | POA: Insufficient documentation

## 2015-08-18 DIAGNOSIS — E876 Hypokalemia: Secondary | ICD-10-CM | POA: Insufficient documentation

## 2015-08-18 DIAGNOSIS — Z596 Low income: Secondary | ICD-10-CM

## 2015-08-18 DIAGNOSIS — N189 Chronic kidney disease, unspecified: Secondary | ICD-10-CM

## 2015-08-18 DIAGNOSIS — R0902 Hypoxemia: Secondary | ICD-10-CM

## 2015-08-18 DIAGNOSIS — L83 Acanthosis nigricans: Secondary | ICD-10-CM | POA: Insufficient documentation

## 2015-08-18 DIAGNOSIS — I129 Hypertensive chronic kidney disease with stage 1 through stage 4 chronic kidney disease, or unspecified chronic kidney disease: Secondary | ICD-10-CM

## 2015-08-18 DIAGNOSIS — I110 Hypertensive heart disease with heart failure: Principal | ICD-10-CM | POA: Insufficient documentation

## 2015-08-18 LAB — CBC WITH DIFFERENTIAL/PLATELET
BASOS PCT: 0 %
Basophils Absolute: 0 10*3/uL (ref 0.0–0.1)
EOS ABS: 0.1 10*3/uL (ref 0.0–0.7)
Eosinophils Relative: 1 %
HCT: 44.1 % (ref 39.0–52.0)
HEMOGLOBIN: 14.8 g/dL (ref 13.0–17.0)
LYMPHS ABS: 1.9 10*3/uL (ref 0.7–4.0)
Lymphocytes Relative: 13 %
MCH: 28.6 pg (ref 26.0–34.0)
MCHC: 33.6 g/dL (ref 30.0–36.0)
MCV: 85.3 fL (ref 78.0–100.0)
Monocytes Absolute: 0.6 10*3/uL (ref 0.1–1.0)
Monocytes Relative: 4 %
NEUTROS PCT: 82 %
Neutro Abs: 12.2 10*3/uL — ABNORMAL HIGH (ref 1.7–7.7)
PLATELETS: 279 10*3/uL (ref 150–400)
RBC: 5.17 MIL/uL (ref 4.22–5.81)
RDW: 13.6 % (ref 11.5–15.5)
WBC: 14.9 10*3/uL — AB (ref 4.0–10.5)

## 2015-08-18 LAB — RAPID URINE DRUG SCREEN, HOSP PERFORMED
Amphetamines: NOT DETECTED
BENZODIAZEPINES: NOT DETECTED
Barbiturates: NOT DETECTED
COCAINE: NOT DETECTED
OPIATES: NOT DETECTED
Tetrahydrocannabinol: NOT DETECTED

## 2015-08-18 LAB — I-STAT VENOUS BLOOD GAS, ED
Acid-Base Excess: 4 mmol/L — ABNORMAL HIGH (ref 0.0–2.0)
Bicarbonate: 30.6 mEq/L — ABNORMAL HIGH (ref 20.0–24.0)
O2 SAT: 75 %
PCO2 VEN: 52.2 mmHg — AB (ref 45.0–50.0)
PO2 VEN: 42 mmHg (ref 31.0–45.0)
TCO2: 32 mmol/L (ref 0–100)
pH, Ven: 7.375 — ABNORMAL HIGH (ref 7.250–7.300)

## 2015-08-18 LAB — COMPREHENSIVE METABOLIC PANEL
ALK PHOS: 107 U/L (ref 38–126)
ALT: 23 U/L (ref 17–63)
AST: 24 U/L (ref 15–41)
Albumin: 3.7 g/dL (ref 3.5–5.0)
Anion gap: 14 (ref 5–15)
BILIRUBIN TOTAL: 1 mg/dL (ref 0.3–1.2)
BUN: 15 mg/dL (ref 6–20)
CALCIUM: 8.9 mg/dL (ref 8.9–10.3)
CO2: 28 mmol/L (ref 22–32)
CREATININE: 1.28 mg/dL — AB (ref 0.61–1.24)
Chloride: 96 mmol/L — ABNORMAL LOW (ref 101–111)
Glucose, Bld: 140 mg/dL — ABNORMAL HIGH (ref 65–99)
Potassium: 3.2 mmol/L — ABNORMAL LOW (ref 3.5–5.1)
Sodium: 138 mmol/L (ref 135–145)
TOTAL PROTEIN: 7.5 g/dL (ref 6.5–8.1)

## 2015-08-18 LAB — URINALYSIS, ROUTINE W REFLEX MICROSCOPIC
BILIRUBIN URINE: NEGATIVE
Glucose, UA: NEGATIVE mg/dL
KETONES UR: NEGATIVE mg/dL
LEUKOCYTES UA: NEGATIVE
NITRITE: NEGATIVE
PH: 7 (ref 5.0–8.0)
PROTEIN: 100 mg/dL — AB
Specific Gravity, Urine: 1.014 (ref 1.005–1.030)

## 2015-08-18 LAB — URINE MICROSCOPIC-ADD ON: BACTERIA UA: NONE SEEN

## 2015-08-18 LAB — TSH: TSH: 1.944 u[IU]/mL (ref 0.350–4.500)

## 2015-08-18 LAB — BRAIN NATRIURETIC PEPTIDE: B NATRIURETIC PEPTIDE 5: 207.1 pg/mL — AB (ref 0.0–100.0)

## 2015-08-18 LAB — I-STAT TROPONIN, ED: TROPONIN I, POC: 0.06 ng/mL (ref 0.00–0.08)

## 2015-08-18 MED ORDER — METOPROLOL TARTRATE 50 MG PO TABS
50.0000 mg | ORAL_TABLET | Freq: Two times a day (BID) | ORAL | Status: DC
  Administered 2015-08-18: 50 mg via ORAL
  Filled 2015-08-18: qty 1

## 2015-08-18 MED ORDER — HYDROCHLOROTHIAZIDE 25 MG PO TABS
25.0000 mg | ORAL_TABLET | Freq: Every day | ORAL | Status: DC
  Administered 2015-08-18: 25 mg via ORAL
  Filled 2015-08-18: qty 1

## 2015-08-18 MED ORDER — ACETAMINOPHEN 325 MG PO TABS
650.0000 mg | ORAL_TABLET | Freq: Four times a day (QID) | ORAL | Status: DC | PRN
  Administered 2015-08-18 – 2015-08-19 (×2): 650 mg via ORAL
  Filled 2015-08-18 (×3): qty 2

## 2015-08-18 MED ORDER — FUROSEMIDE 10 MG/ML IJ SOLN
40.0000 mg | Freq: Two times a day (BID) | INTRAMUSCULAR | Status: DC
  Administered 2015-08-18 – 2015-08-19 (×2): 40 mg via INTRAVENOUS
  Filled 2015-08-18 (×2): qty 4

## 2015-08-18 MED ORDER — HEPARIN SODIUM (PORCINE) 5000 UNIT/ML IJ SOLN
5000.0000 [IU] | Freq: Three times a day (TID) | INTRAMUSCULAR | Status: DC
  Administered 2015-08-18 – 2015-08-19 (×4): 5000 [IU] via SUBCUTANEOUS
  Filled 2015-08-18 (×4): qty 1

## 2015-08-18 MED ORDER — POTASSIUM CHLORIDE CRYS ER 20 MEQ PO TBCR
40.0000 meq | EXTENDED_RELEASE_TABLET | Freq: Once | ORAL | Status: AC
Start: 2015-08-18 — End: 2015-08-18
  Administered 2015-08-18: 40 meq via ORAL
  Filled 2015-08-18: qty 2

## 2015-08-18 MED ORDER — ACETAMINOPHEN 500 MG PO TABS
1000.0000 mg | ORAL_TABLET | Freq: Once | ORAL | Status: AC
  Administered 2015-08-18: 1000 mg via ORAL
  Filled 2015-08-18: qty 2

## 2015-08-18 MED ORDER — NITROGLYCERIN 2 % TD OINT
1.0000 [in_us] | TOPICAL_OINTMENT | Freq: Once | TRANSDERMAL | Status: AC
  Administered 2015-08-18: 1 [in_us] via TOPICAL
  Filled 2015-08-18: qty 1

## 2015-08-18 MED ORDER — LISINOPRIL 20 MG PO TABS
20.0000 mg | ORAL_TABLET | Freq: Once | ORAL | Status: AC
  Administered 2015-08-18: 20 mg via ORAL
  Filled 2015-08-18: qty 1

## 2015-08-18 MED ORDER — HYDROCHLOROTHIAZIDE 25 MG PO TABS: 25.0000 mg | ORAL_TABLET | Freq: Every day | ORAL | Status: DC

## 2015-08-18 MED ORDER — FUROSEMIDE 10 MG/ML IJ SOLN
40.0000 mg | INTRAMUSCULAR | Status: AC
  Administered 2015-08-18: 40 mg via INTRAVENOUS
  Filled 2015-08-18: qty 4

## 2015-08-18 MED ORDER — CLONIDINE HCL 0.2 MG PO TABS
0.2000 mg | ORAL_TABLET | Freq: Once | ORAL | Status: AC
Start: 2015-08-18 — End: 2015-08-18
  Administered 2015-08-18: 0.2 mg via ORAL
  Filled 2015-08-18: qty 1

## 2015-08-18 MED ORDER — METOPROLOL TARTRATE 25 MG PO TABS
50.0000 mg | ORAL_TABLET | Freq: Once | ORAL | Status: AC
  Administered 2015-08-18: 50 mg via ORAL
  Filled 2015-08-18: qty 2

## 2015-08-18 NOTE — ED Notes (Signed)
MD at bedside. 

## 2015-08-18 NOTE — ED Notes (Signed)
Pt placed on 2L spO2 due to sats at 87%.

## 2015-08-18 NOTE — Progress Notes (Signed)
Patient arrived in the unit accompanied by nursing staff and family members. Orientation given to the room. Patient verbalizes understanding.

## 2015-08-18 NOTE — Care Management Note (Signed)
Case Management Note  Patient Details  Name: Lonia MadChristopher L Auzenne MRN: [id number] Date of Birth: 11/11/1980  Subjective/Objective:  Mr Luiz BlareGraves is a 35 y.o. 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 Reports that he has been out of his medications for 10 days. (Lopressor, Lisinipril, Hydralazine and HCTZ ). All of these are on the [facility].00 [facility] at [facility]. CM shared this information with patient and asked if this is affordable to him. He agreed that he could afford this and was unaware that these medications were this inexpensive.  CM suggested he find PCP and also ask for samples when he attends routine annual physicals for situations such as these, when he runs out of meds so that he can avoid an EDV.                  Action/Plan: Will sign off for now. Encouraged pt to find PCP in his community. Will refer to [employer], Buddy DutyFelicia Evans.    Expected Discharge Date:                  Expected Discharge Plan:     In-House Referral:     Discharge planning Services  CM Consult, Medication Assistance  Post Acute Care Choice:    Choice offered to:  Patient  DME Arranged:    DME Agency:     HH Arranged:    HH Agency:     Status of Service:  Completed, signed off  Medicare Important Message Given:    Date Medicare IM Given:    Medicare IM give by:    Date Additional Medicare IM Given:    Additional Medicare Important Message give by:     If discussed at Long Length of Stay Meetings, dates discussed:    Additional Comments:  Yvone NeuCrutchfield, Kobi Aller M, RN 08/18/2015, 3:55 PM

## 2015-08-18 NOTE — ED Notes (Signed)
Pt here for htn, sts out of his meds for 1.5 weeks. Denies any symptoms associated with htn other than a mild headache.

## 2015-08-18 NOTE — ED Notes (Signed)
Carb modified lunch tray ordered 

## 2015-08-18 NOTE — Progress Notes (Addendum)
Went to patient's room due to headache, some slurring of works (per mom), and BP 192/140 (increased from 131/86 earlier). The patient was complaining of a frontal headache, which attributed with the nitroglycerine patch. He denied any confusion. Family was concerned that this was due to his elevated BP, as they witnessed him having difficulty getting his words out. Of note, the patient has been awake since 4 am and he describes feeling exhausted and thirsty.  Filed Vitals:   08/18/15 1615 08/18/15 1700 08/18/15 2040 08/18/15 2252  BP: 143/79 159/115 153/103 192/140  Pulse: 74 76 73   Temp:  98 F (36.7 C) 97.8 F (36.6 C)   TempSrc:  Oral Oral   Resp: 19 18 18    Height:  5' 6.5" (1.689 m)    Weight:  370 lb 3.2 oz (167.922 kg)    SpO2: 97% 100% 100%    Physical Exam:  Cardiovascular: RRR, no m/r/g Pulmonary: CTAB Neuro: AAOx4, repeats "no ifs, ands, or butts", could not recall 3 of 3 items 5 minutes later. 5/5 strength in all extremities. FTN normal. EOMI, PERRL, tongue midline, face symmetric, sensation to light touch in tact throughout,   A/P Sx could be related to HTN, fatigue, or both. It seemed peculiar that he was unable to remember a single item, even when I specifically told him I would ask him about them shortly. Will give 0.2 mg clonidine and tylenol now. If his BP or he has new/worsening neurological symptoms, he may need more close following of BP with gtt medications. I indicated the RN to page me if his BP failed to respond 45 minutes after clonidine administration or if he developed new or worsening neurological signs.

## 2015-08-18 NOTE — ED Notes (Signed)
Pt reports walking over to the restroom twice since receiving lasix.

## 2015-08-18 NOTE — ED Notes (Addendum)
Patient states he has been out of his BP med for awhile.  When asked to specify awhile he said about 1 and a half weeks or more.  Stated he could feel his BP was up due to having a headache.

## 2015-08-18 NOTE — H&P (Signed)
Date: 08/18/2015               Patient Name:  Justin Brown MRN: [id number]  DOB: 09/25/1980 Age / Sex: 35 y.o., male   PCP: Pcp Not In System         Medical Service: Internal Medicine Teaching Service         Attending Physician: Dr. Randall Hissornelius N Van Dam, MD    First Contact: Dr. Dimple Caseyice Pager: [phone]  Second Contact: Dr. Danella Pentonruong Pager: [phone]       After Hours (After 5p/  First Contact Pager: [phone]  weekends / holidays): Second Contact Pager: [phone]   Chief Complaint: Symptomatic hypertension  History of Present Illness: 35 y/o african [place] man with PMHx of hypertension and morbid obesity presents to ED via personal vehicle for headache and hypertension. This headache started Friday morning with improvement during the day then woke him up at 4am this morning and persisted. The pain is achy, throbbing in character and similar to past episodes. It did not improve with goody powder. He takes HCTZ 25mg , lopressor 50mg  BID, and lisinopril 20mg  as home medicines but has been out of all these for the past week and a half. He denies any associated vision changes, weakness, dizziness, shortness of breath, or chest pain. He has not observed a recent weight change or leg swelling. He was previously feeling well.   [facility] for malignant hypertension with acute renal failure. During that visit evaluation was negative for RAS or hyperaldosteronism. TTE showed LVEF 60-65% with mild LVH and pulmonary vascular congestion. 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 He was discharged on lopressor, lisinopril, and hydralazine.  On arrival to the ED he was found to be hypertensive to 250s/150s. He was placed on supplemental oxygen for an episode of hypoxia to 89% CXR was obtained showing mild vascular congestion. BNP of 207.1.  Meds: Current Facility-Administered Medications    Medication Dose Route Frequency Provider Last Rate Last Dose  . heparin injection 5,000 Units  5,000 Units Subcutaneous 3 times per day Denton Brickiana M Truong, MD      . hydrochlorothiazide (HYDRODIURIL) tablet 25 mg  25 mg Oral Daily Laurence Spatesachel Morgan Little, MD   25 mg at 08/18/15 0733  . [START ON 08/19/2015] hydrochlorothiazide (HYDRODIURIL) tablet 25 mg  25 mg Oral Daily Denton Brickiana M Truong, MD      . metoprolol tartrate (LOPRESSOR) tablet 50 mg  50 mg Oral BID Denton Brickiana M Truong, MD   50 mg at 08/18/15 1300   Current Outpatient Prescriptions  Medication Sig Dispense Refill  . hydrochlorothiazide (HYDRODIURIL) 25 MG tablet Take 1 tablet (25 mg total) by mouth daily. 30 tablet 0  . lisinopril (PRINIVIL,ZESTRIL) 20 MG tablet Take 1 tablet (20 mg total) by mouth daily. 30 tablet 0  . metoprolol (LOPRESSOR) 50 MG tablet Take 50 mg by mouth 2 (two) times daily.    Marland Kitchen. albuterol (PROVENTIL HFA;VENTOLIN HFA) 108 (90 BASE) MCG/ACT inhaler Inhale 2 puffs into the lungs every 6 (six) hours as needed for wheezing. For shortness of breath (Patient not taking: Reported on 08/18/2015) 1 Inhaler 3  . Artificial Tear Ointment (ARTIFICIAL TEARS) ointment Place into both eyes 3 (three) times daily. (Patient not taking: Reported on 08/18/2015) 3.5 g 12  . ketotifen (ZADITOR) 0.025 % ophthalmic solution Place 1 drop into both eyes 3 (three) times daily. (Patient not taking: Reported on  08/18/2015) 5 mL 0    Allergies: Allergies as of 08/18/2015  . (No Known Allergies)   Past Medical History  Diagnosis Date  . Asthma     as child  . Obesity   . Asthma   . Hypertension   . Obesity    Past Surgical History  Procedure Laterality Date  . Orif humerus fracture  12/24/2011    Procedure: OPEN REDUCTION INTERNAL FIXATION (ORIF) HUMERAL SHAFT FRACTURE;  Surgeon: Kennieth Rad, MD;  Location: [facility] OR;  Service: Orthopedics;  Laterality: Right;   History reviewed. No pertinent family history. Social History   Social History  .  Marital Status: Single    Spouse Name: N/A  . Number of Children: N/A  . Years of Education: N/A   Occupational History  . Not on file.   Social History Main Topics  . Smoking status: Never Smoker   . Smokeless tobacco: Not on file  . Alcohol Use: Yes     Comment: Socially   . Drug Use: No  . Sexual Activity: Not on file   Other Topics Concern  . Not on file   Social History Narrative   ** Merged History Encounter **        Review of Systems: Review of Systems  Constitutional: Negative for fever and diaphoresis.  Eyes: Negative for blurred vision.  Respiratory: Negative for shortness of breath.   Cardiovascular: Negative for chest pain and leg swelling.  Gastrointestinal: Negative for nausea and vomiting.  Genitourinary: Negative for frequency.  Musculoskeletal: Negative for falls.  Skin: Negative for rash.  Neurological: Positive for headaches. Negative for dizziness and weakness.  Endo/Heme/Allergies: Does not bruise/bleed easily.  Psychiatric/Behavioral: The patient is not nervous/anxious.     Physical Exam: Blood pressure 137/82, pulse 72, temperature 98.2 F (36.8 C), temperature source Oral, resp. rate 22, height 5' 6.5" (1.689 m), weight 131.09 kg (289 lb), SpO2 97 %.  GENERAL- alert, co-operative, NAD HEENT- Large beefy neck, no carotid bruit, no JVD, moist oral mucosa CARDIAC- RRR, no murmurs, rubs or gallops. RESP- Distant breath sounds very hard to assess, no crackles or wheezes appreciated ABDOMEN- Obese, nontender, no guarding or rebound, normoactive bowel sounds present NEURO- No obvious Cr N abnormality, strength upper extremities- 5/5 EXTREMITIES- Symmetric, trace pedal edema b/l. SKIN- Warm, dry, acanthosis nigricans+ PSYCH- Normal mood and affect, appropriate thought content and speech.   Lab results: Basic Metabolic Panel:  Recent Labs  16/10/96 0809  NA 138  K 3.2*  CL 96*  CO2 28  GLUCOSE 140*  BUN 15  CREATININE 1.28*  CALCIUM  8.9   Liver Function Tests:  Recent Labs  08/18/15 0809  AST 24  ALT 23  ALKPHOS 107  BILITOT 1.0  PROT 7.5  ALBUMIN 3.7   CBC:  Recent Labs  08/18/15 0809  WBC 14.9*  NEUTROABS 12.2*  HGB 14.8  HCT 44.1  MCV 85.3  PLT 279    Recent Labs  08/18/15 0814  COLORURINE YELLOW  LABSPEC 1.014  PHURINE 7.0  GLUCOSEU NEGATIVE  HGBUR TRACE*  BILIRUBINUR NEGATIVE  KETONESUR NEGATIVE  PROTEINUR 100*  NITRITE NEGATIVE  LEUKOCYTESUR NEGATIVE    Imaging results:  Dg Chest 2 View  08/18/2015  CLINICAL DATA:  Severe hypertension.  Hypoxia.  Headache. EXAM: CHEST - 2 VIEW COMPARISON:  Two-view chest x-ray 12/24/2011 FINDINGS: The heart is enlarged. Moderate diffuse edema is present. There are no effusions. There is no significant airspace consolidation. The visualized soft tissues and bony  thorax are unremarkable. IMPRESSION: 1. Cardiomegaly and moderate edema compatible with congestive heart failure. 2. No focal airspace consolidation. Electronically Signed   By: Marin Roberts M.D.   On: 08/18/2015 09:33   Other results: EKG: atrial enlargement, T wave inversion in V6  Assessment & Plan by Problem: Malignant hypertension: symptomatic hypertension due to medication noncompliance for the past 1.5 wks. Also has pulmonary vascular congestion with some hypoxia Patient in no acute distress. No troponin elevation, no chest paint, no SOB. No neurological deficits or AMS. 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 Needs formal sleep study. -metoprolol  BID -HCTZ  -Lasix  IV -Lisinopril , will hold if worsening SCr -F/U CBC, Bmet  Hypokalemia: Mild hypokalemia 3.2. Getting lasix. 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 Proteinuria, SCr 1.28 from 1.3 in 02/2015 and 1.18 in 04/2014. On ACE-I  outpatient.  Poor medical access: Medication noncompliance reportedly due to not affording medications. His med list is pretty much on $4 formulary already. A top priority since he now has 2 hospitalizations within the past 6 months related to being off his meds. -Consult to case management for assistance programs if available or other recs  Impaired fasting glucose/diabetes: Elevated random glucose, always high on chart review. Family history of diabetes. Acanthosis nigricans noted on physical exam. Morbid obesity. -check Hgb A1c  Diet: Carb mod VTE ppx: [place] enoxaparin FULL CODE  Dispo: Disposition is deferred at this time, awaiting improvement of current medical problems. Anticipated discharge in approximately 1-3 day(s).   The patient does not have a current PCP (Pcp Not In System) and does need an [facility] hospital follow-up appointment after discharge.  The patient does not have transportation limitations that hinder transportation to clinic appointments.  Signed: Fuller Plan, MD 08/18/2015, 1:36 PM

## 2015-08-18 NOTE — ED Provider Notes (Signed)
CSN: [id number]     Arrival date & time 08/18/15  9147 History   First MD Initiated Contact with Patient 08/18/15 [phone]     Chief Complaint  Patient presents with  . Hypertension     (Consider location/radiation/quality/duration/timing/severity/associated sxs/prior Treatment) HPI Comments: 35 year old male with history of hypertension and morbid obesity who presents with hypertension. Patient states that he has been out of his blood pressure medications for 1.5 weeks and feels like his blood pressure has been running high. He states he has occasional headaches that usually go away with Goody powder but he can always tell when his blood pressure is elevated because the headaches persist despite taking over-the-counter medications. He denies any sudden onset of severe headache. No visual changes. He denies chest pain or shortness of breath. No cough/cold symptoms, fevers, or recent illness. He does not think that he has gained weight recently. He denies any shortness of breath with exertion.  Patient is a 35 y.o. male presenting with hypertension. The history is provided by the patient.  Hypertension    Past Medical History  Diagnosis Date  . Asthma     as child  . Obesity   . Asthma   . Hypertension   . Obesity    Past Surgical History  Procedure Laterality Date  . Orif humerus fracture  12/24/2011    Procedure: OPEN REDUCTION INTERNAL FIXATION (ORIF) HUMERAL SHAFT FRACTURE;  Surgeon: Kennieth Rad, MD;  Location: [facility] OR;  Service: Orthopedics;  Laterality: Right;   History reviewed. No pertinent family history. Social History  Substance Use Topics  . Smoking status: Never Smoker   . Smokeless tobacco: None  . Alcohol Use: Yes     Comment: Socially     Review of Systems 10 Systems reviewed and are negative for acute change except as noted in the HPI.   Allergies  Review of patient's allergies indicates no known allergies.  Home Medications   Prior to Admission  medications   Medication Sig Start Date End Date Taking? Authorizing Provider  hydrochlorothiazide (HYDRODIURIL) 25 MG tablet Take 1 tablet (25 mg total) by mouth daily. 04/15/14  Yes Marisa Severin, MD  lisinopril (PRINIVIL,ZESTRIL) 20 MG tablet Take 1 tablet (20 mg total) by mouth daily. 04/15/14  Yes Marisa Severin, MD  metoprolol (LOPRESSOR) 50 MG tablet Take 50 mg by mouth 2 (two) times daily. 02/15/15 02/15/16 Yes Historical Provider, MD  albuterol (PROVENTIL HFA;VENTOLIN HFA) 108 (90 BASE) MCG/ACT inhaler Inhale 2 puffs into the lungs every 6 (six) hours as needed for wheezing. For shortness of breath Patient not taking: Reported on 08/18/2015 11/17/12   Calvert Cantor, MD  Artificial Tear Ointment (ARTIFICIAL TEARS) ointment Place into both eyes 3 (three) times daily. Patient not taking: Reported on 08/18/2015 11/17/12   Calvert Cantor, MD  ketotifen (ZADITOR) 0.025 % ophthalmic solution Place 1 drop into both eyes 3 (three) times daily. Patient not taking: Reported on 08/18/2015 11/17/12   Calvert Cantor, MD   BP 143/79 mmHg  Pulse 74  Temp(Src) 98.2 F (36.8 C) (Oral)  Resp 19  Ht 5' 6.5" (1.689 m)  Wt 289 lb (131.09 kg)  BMI 45.95 kg/m2  SpO2 97% Physical Exam  Constitutional: He is oriented to person, place, and time. He appears well-developed and well-nourished. No distress.  Morbidly obese  HENT:  Head: Normocephalic and atraumatic.  Mouth/Throat: Oropharynx is clear and moist.  Moist mucous membranes  Eyes: Conjunctivae are normal. Pupils are equal, round, and reactive to light.  Neck: Neck supple.  Cardiovascular: Normal rate, regular rhythm and normal heart sounds.   No murmur heard. Distant heart sounds  Pulmonary/Chest: Breath sounds normal. No respiratory distress.  Difficult to auscultate 2/2 body habitus, mild tachypnea but no wheezes  Abdominal: Soft. Bowel sounds are normal. He exhibits no distension. There is no tenderness.  Musculoskeletal: He exhibits no edema.  1+ pitting edema  BLE  Neurological: He is alert and oriented to person, place, and time.  Fluent speech  Skin: Skin is warm and dry.  Psychiatric: He has a normal mood and affect. Judgment normal.  Nursing note and vitals reviewed.   ED Course  Procedures (including critical care time) Labs Review Labs Reviewed  COMPREHENSIVE METABOLIC PANEL - Abnormal; Notable for the following:    Potassium 3.2 (*)    Chloride 96 (*)    Glucose, Bld 140 (*)    Creatinine, Ser 1.28 (*)    All other components within normal limits  CBC WITH DIFFERENTIAL/PLATELET - Abnormal; Notable for the following:    WBC 14.9 (*)    Neutro Abs 12.2 (*)    All other components within normal limits  BRAIN NATRIURETIC PEPTIDE - Abnormal; Notable for the following:    B Natriuretic Peptide 207.1 (*)    All other components within normal limits  URINALYSIS, ROUTINE W REFLEX MICROSCOPIC (NOT AT [facility]) - Abnormal; Notable for the following:    Hgb urine dipstick TRACE (*)    Protein, ur 100 (*)    All other components within normal limits  URINE MICROSCOPIC-ADD ON - Abnormal; Notable for the following:    Squamous Epithelial / LPF 0-5 (*)    All other components within normal limits  I-STAT VENOUS BLOOD GAS, ED - Abnormal; Notable for the following:    pH, Ven 7.375 (*)    pCO2, Ven 52.2 (*)    Bicarbonate 30.6 (*)    Acid-Base Excess 4.0 (*)    All other components within normal limits  TSH  BLOOD GAS, VENOUS  HEMOGLOBIN A1C  URINE RAPID DRUG SCREEN, HOSP PERFORMED  I-STAT TROPOININ, ED    Imaging Review Dg Chest 2 View  08/18/2015  CLINICAL DATA:  Severe hypertension.  Hypoxia.  Headache. EXAM: CHEST - 2 VIEW COMPARISON:  Two-view chest x-ray 12/24/2011 FINDINGS: The heart is enlarged. Moderate diffuse edema is present. There are no effusions. There is no significant airspace consolidation. The visualized soft tissues and bony thorax are unremarkable. IMPRESSION: 1. Cardiomegaly and moderate edema compatible with  congestive heart failure. 2. No focal airspace consolidation. Electronically Signed   By: Marin Robertshristopher  Mattern M.D.   On: 08/18/2015 09:33   I have personally reviewed and evaluated these images and lab results as part of my medical decision-making.   EKG Interpretation   Date/Time:  Saturday August 18 2015 07:36:15 EST Ventricular Rate:  93 PR Interval:  188 QRS Duration: 95 QT Interval:  396 QTC Calculation: 493 R Axis:   -168 Text Interpretation:  Sinus rhythm Biatrial enlargement S1,S2,S3 pattern  Nonspecific T abnormalities, lateral leads Prolonged QT interval atrial  enlargement, T wave inversion V6, abnormal R wave progression through  precordial leads Confirmed by Ellowyn Rieves MD, Alyla Pietila ([id number] on 08/18/2015  8:54:55 AM     Medications  hydrochlorothiazide (HYDRODIURIL) tablet 25 mg (25 mg Oral Given 08/18/15 0733)  heparin injection 5,000 Units (5,000 Units Subcutaneous Given 08/18/15 1629)  hydrochlorothiazide (HYDRODIURIL) tablet 25 mg (not administered)  metoprolol tartrate (LOPRESSOR) tablet 50 mg (50 mg Oral Not Given  08/18/15 1300)  furosemide (LASIX) injection 40 mg (not administered)  lisinopril (PRINIVIL,ZESTRIL) tablet 20 mg (20 mg Oral Given 08/18/15 0733)  metoprolol tartrate (LOPRESSOR) tablet 50 mg (50 mg Oral Given 08/18/15 0733)  nitroGLYCERIN (NITROGLYN) 2 % ointment 1 inch (1 inch Topical Given 08/18/15 0843)  acetaminophen (TYLENOL) tablet 1,000 mg (1,000 mg Oral Given 08/18/15 0844)  furosemide (LASIX) injection 40 mg (40 mg Intravenous Given 08/18/15 1038)  potassium chloride SA (K-DUR,KLOR-CON) CR tablet 40 mEq (40 mEq Oral Given 08/18/15 1258)    MDM   Final diagnoses:  Acute congestive heart failure, unspecified congestive heart failure type (HCC)  Hypertensive emergency  Hypoxia   Pt w/ h/o HTN presents for refill of his medications which he has been out of for 1.5 weeks. Of note, I reviewed the patient's chart and he had a hospitalization at an outside  hospital last fall for hypertensive emergency with AK I and concern for PRES. on exam, he was awake, alert, comfortable, mildly to But in no distress. Vital signs notable for hypoxia in the 80s on room air, BP 250/157. No abnormal lung sounds although exam limited by patient's body habitus. Mild pitting edema bilateral lower extremities. EKG shows sinus rhythm, evidence of LVH and biatrial enlargement, prolonged QT, suggestive of long-standing poorly controlled hypertension. Because of hypoxia, place the patient on 2 L nasal cannula and obtained above lab work including BNP, troponin as well as chest x-ray. Gave patient his home medications.  Labwork shows venous pH 7.37, CO2 52, WBC 14.9, BNP 207, negative troponin, creatinine 1.28. some Protein in urine. Chest x-ray shows cardiomegaly and moderate edema suggestive of CHF. His back that pulmonary edema is the cause of the patient's hypoxia. He remains stable on O2 via nasal cannula on reexamination. Gave the patient IV Lasix and discussed admission with internal medicine teaching service. Patient admitted to Dr. Clinton Gallant service for further care.  Laurence Spates, MD 08/18/15 [phone]

## 2015-08-19 DIAGNOSIS — G4733 Obstructive sleep apnea (adult) (pediatric): Secondary | ICD-10-CM

## 2015-08-19 DIAGNOSIS — I5023 Acute on chronic systolic (congestive) heart failure: Secondary | ICD-10-CM

## 2015-08-19 DIAGNOSIS — I110 Hypertensive heart disease with heart failure: Principal | ICD-10-CM

## 2015-08-19 LAB — BASIC METABOLIC PANEL
ANION GAP: 13 (ref 5–15)
BUN: 16 mg/dL (ref 6–20)
CALCIUM: 8.8 mg/dL — AB (ref 8.9–10.3)
CO2: 29 mmol/L (ref 22–32)
Chloride: 95 mmol/L — ABNORMAL LOW (ref 101–111)
Creatinine, Ser: 1.47 mg/dL — ABNORMAL HIGH (ref 0.61–1.24)
GFR calc Af Amer: >60 mL/min (ref >60)
GFR calc non Af Amer: >60 mL/min (ref >60)
GLUCOSE: 130 mg/dL — AB (ref 65–99)
Potassium: 3 mmol/L — ABNORMAL LOW (ref 3.5–5.1)
Sodium: 137 mmol/L (ref 135–145)

## 2015-08-19 LAB — CBC
HEMATOCRIT: 41.7 % (ref 39.0–52.0)
HEMOGLOBIN: 13.3 g/dL (ref 13.0–17.0)
MCH: 27.8 pg (ref 26.0–34.0)
MCHC: 31.9 g/dL (ref 30.0–36.0)
MCV: 87.2 fL (ref 78.0–100.0)
Platelets: 315 10*3/uL (ref 150–400)
RBC: 4.78 MIL/uL (ref 4.22–5.81)
RDW: 13.9 % (ref 11.5–15.5)
WBC: 16.1 10*3/uL — ABNORMAL HIGH (ref 4.0–10.5)

## 2015-08-19 MED ORDER — METOPROLOL TARTRATE 50 MG PO TABS: 50.0000 mg | ORAL_TABLET | Freq: Two times a day (BID) | ORAL | Status: DC

## 2015-08-19 MED ORDER — HYDROCHLOROTHIAZIDE 25 MG PO TABS
25.0000 mg | ORAL_TABLET | Freq: Every day | ORAL | Status: DC
  Administered 2015-08-19: 25 mg via ORAL
  Filled 2015-08-19: qty 1

## 2015-08-19 MED ORDER — HYDROCHLOROTHIAZIDE 25 MG PO TABS: 25.0000 mg | ORAL_TABLET | Freq: Every day | ORAL | Status: DC

## 2015-08-19 MED ORDER — HYDRALAZINE HCL 25 MG PO TABS: 25.0000 mg | ORAL_TABLET | Freq: Three times a day (TID) | ORAL | Status: DC

## 2015-08-19 MED ORDER — POTASSIUM CHLORIDE ER 20 MEQ PO TBCR: 20.0000 meq | EXTENDED_RELEASE_TABLET | Freq: Every day | ORAL | Status: AC

## 2015-08-19 MED ORDER — POTASSIUM CHLORIDE CRYS ER 20 MEQ PO TBCR
30.0000 meq | EXTENDED_RELEASE_TABLET | Freq: Two times a day (BID) | ORAL | Status: DC
  Administered 2015-08-19: 30 meq via ORAL
  Filled 2015-08-19: qty 1

## 2015-08-19 MED ORDER — FUROSEMIDE 40 MG PO TABS: 40.0000 mg | ORAL_TABLET | Freq: Every day | ORAL | Status: DC

## 2015-08-19 MED ORDER — LISINOPRIL 20 MG PO TABS: 20.0000 mg | ORAL_TABLET | Freq: Every day | ORAL | Status: DC

## 2015-08-19 MED ORDER — METOPROLOL TARTRATE 50 MG PO TABS
50.0000 mg | ORAL_TABLET | Freq: Two times a day (BID) | ORAL | Status: DC
  Administered 2015-08-19: 50 mg via ORAL
  Filled 2015-08-19: qty 1

## 2015-08-19 NOTE — Progress Notes (Signed)
Subjective: Had episodic headache on nitro patch overnight, headache improved ever since patch removed. Otherwise feeling much better this morning, no dyspnea or chest pain.  Objective: Vital signs in last 24 hours: Filed Vitals:   08/18/15 2252 08/19/15 0114 08/19/15 0506 08/19/15 0853  BP: 192/140 190/110 183/105 164/98  Pulse:   77 80  Temp:   97.8 F (36.6 C) 97.8 F (36.6 C)  TempSrc:   Oral   Resp:   18 18  Height:      Weight:   167.287 kg (368 lb 12.8 oz)   SpO2:   99% 94%   Weight change: 36.832 kg (81 lb 3.2 oz)  Intake/Output Summary (Last 24 hours) at 08/19/15 1206 Last data filed at 08/19/15 0900  Gross per 24 hour  Intake   1080 ml  Output      0 ml  Net   1080 ml   GENERAL- alert, co-operative, NAD HEENT- Large beefy neck, moist oral mucosa CARDIAC- RRR, no murmurs, rubs or gallops. RESP- Distant breath sounds, no crackles or wheezes appreciated ABDOMEN- Obese, nontender, no guarding or rebound, normoactive bowel sounds present EXTREMITIES- Symmetric, trace pedal edema b/l. SKIN- Warm, dry, acanthosis nigricans PSYCH- Normal mood and affect, appropriate thought content and speech.  Lab Results: Basic Metabolic Panel:  Recent Labs Lab 08/18/15 0809 08/19/15 0325  NA 138 137  K 3.2* 3.0*  CL 96* 95*  CO2 28 29  GLUCOSE 140* 130*  BUN 15 16  CREATININE 1.28* 1.47*  CALCIUM 8.9 8.8*   Liver Function Tests:  Recent Labs Lab 08/18/15 0809  AST 24  ALT 23  ALKPHOS 107  BILITOT 1.0  PROT 7.5  ALBUMIN 3.7   CBC:  Recent Labs Lab 08/18/15 0809 08/19/15 0325  WBC 14.9* 16.1*  NEUTROABS 12.2*  --   HGB 14.8 13.3  HCT 44.1 41.7  MCV 85.3 87.2  PLT 279 315   Thyroid Function Tests:  Recent Labs Lab 08/18/15 1145  TSH 1.944   Urine Drug Screen: Drugs of Abuse     Component Value Date/Time   LABOPIA NONE DETECTED 08/18/2015 0814   COCAINSCRNUR NONE DETECTED 08/18/2015 0814   LABBENZ NONE DETECTED 08/18/2015 0814   AMPHETMU  NONE DETECTED 08/18/2015 0814   THCU NONE DETECTED 08/18/2015 0814   LABBARB NONE DETECTED 08/18/2015 0814   Urinalysis:  Recent Labs Lab 08/18/15 0814  COLORURINE YELLOW  LABSPEC 1.014  PHURINE 7.0  GLUCOSEU NEGATIVE  HGBUR TRACE*  BILIRUBINUR NEGATIVE  KETONESUR NEGATIVE  PROTEINUR 100*  NITRITE NEGATIVE  LEUKOCYTESUR NEGATIVE    Micro Results: No results found for this or any previous visit (from the past 240 hour(s)). Studies/Results: Dg Chest 2 View  08/18/2015  CLINICAL DATA:  Severe hypertension.  Hypoxia.  Headache. EXAM: CHEST - 2 VIEW COMPARISON:  Two-view chest x-ray 12/24/2011 FINDINGS: The heart is enlarged. Moderate diffuse edema is present. There are no effusions. There is no significant airspace consolidation. The visualized soft tissues and bony thorax are unremarkable. IMPRESSION: 1. Cardiomegaly and moderate edema compatible with congestive heart failure. 2. No focal airspace consolidation. Electronically Signed   By: Marin Robertshristopher  Mattern M.D.   On: 08/18/2015 09:33   Medications: I have reviewed the patient's current medications. Scheduled Meds: . furosemide  40 mg Oral Daily  . heparin  5,000 Units Subcutaneous 3 times per day  . hydrochlorothiazide  25 mg Oral Daily  . metoprolol  50 mg Oral BID  . potassium chloride  30 mEq Oral  BID   Continuous Infusions:  PRN Meds:.acetaminophen Assessment/Plan: Malignant hypertension: symptomatic hypertension due to medication noncompliance for the past 1.5 wks. Also has pulmonary vascular congestion with some hypoxia Patient in no acute distress. No troponin elevation, no chest paint, no SOB. No neurological deficits or AMS. 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 Needs formal sleep study. -metoprolol  BID -HCTZ  -Lasix  to PO -Lisinopril   Hypokalemia:  Hypokalemia 3.2->3.0 with  IV lasix. 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 Proteinuria, SCr 1.28 from 1.3 in 02/2015 and 1.18 in 04/2014. On ACE-I outpatient.  Poor medical access: Medication noncompliance, on low cost meds, will plug in with [facility] for F/U.  Impaired fasting glucose/diabetes: Elevated random glucose, always high on chart review. Family history of diabetes. Acanthosis nigricans noted on physical exam. Morbid obesity. -Defer to outpt F/U HgbA1c, sugars  Diet: Carb mod VTE ppx:  Beach enoxaparin FULL CODE  Dispo: Anticipate discharge to home later today pending good continued clinical progress.  The patient does not have a current PCP (Pcp Not In System) and does need an [facility] hospital follow-up appointment after discharge.  The patient does not have transportation limitations that hinder transportation to clinic appointments.   LOS: 1 day   Fuller Plan, MD 08/19/2015, 12:06 PM

## 2015-08-19 NOTE — Discharge Summary (Signed)
Name: Justin Brown L Plaia MRN: [id number] DOB: 09/17/1980 35 y.o. PCP: Pcp Not In System  Date of Admission: 08/18/2015  5:53 AM Date of Discharge: 08/19/2015 Attending Physician: Dr. Daiva EvesVan Dam  Discharge Diagnosis: Active Problems:   CHF (congestive heart failure) (HCC)   HTN (hypertension), malignant  Discharge Medications:   Medication List    STOP taking these medications        albuterol 108 (90 Base) MCG/ACT inhaler  Commonly known as:  PROVENTIL HFA;VENTOLIN HFA     artificial tears ointment     ketotifen 0.025 % ophthalmic solution  Commonly known as:  ZADITOR      TAKE these medications        hydrALAZINE 25 MG tablet  Commonly known as:  APRESOLINE  Take 1 tablet (25 mg total) by mouth 3 (three) times daily.     hydrochlorothiazide 25 MG tablet  Commonly known as:  HYDRODIURIL  Take 1 tablet (25 mg total) by mouth daily.     lisinopril 20 MG tablet  Commonly known as:  PRINIVIL,ZESTRIL  Take 1 tablet (20 mg total) by mouth daily.     metoprolol 50 MG tablet  Commonly known as:  LOPRESSOR  Take 1 tablet (50 mg total) by mouth 2 (two) times daily.     Potassium Chloride ER 20 MEQ Tbcr  Take 20 mEq by mouth daily.        Disposition and follow-up:   Mr.Juaquin L Alvi was [facility] in Good condition.  At the hospital follow up visit please address:  1. Exacerbation of systolic congestive heart failure 2/2 hypertension: Episode precipitated by 1.5 weeks off medications. Needs to be established with a provider for medication refills for chronic management. He was supposed to be on 100mg  hydralazine TID according to [facility] so can titrate this up if he is hypertensive on discharge doses.  2. Presumed sleep apnea: Patient with previous history of recommendation for sleep study but does not have a formal referral or workup with financial/insurance limitations as a component of  this. This is likely mediating part of his severe hypertension.  3. Hypokalemia: Was discharged with potassium supplementation 20 mEq. Was hypokalemic with diuresis inpatient, reassess appropriate to continued medications and metabolic panel.  4.  Labs / imaging needed at time of follow-up: Bmet  Follow-up Appointments: Follow-up Information    Follow up with Must have follow up for BP.   Why:  will be in touch to help with [facility]   Contact information:   Buddy DutyFelicia Evans      [facility] with [facility] On 08/23/2015.   Why:  @11 :30am for hospital follow up   Contact information:   [address] [place] [id number] [phone]      Discharge Instructions:   Consultations:    Procedures Performed:  Dg Chest 2 View  08/18/2015  CLINICAL DATA:  Severe hypertension.  Hypoxia.  Headache. EXAM: CHEST - 2 VIEW COMPARISON:  Two-view chest x-ray 12/24/2011 FINDINGS: The heart is enlarged. Moderate diffuse edema is present. There are no effusions. There is no significant airspace consolidation. The visualized soft tissues and bony thorax are unremarkable. IMPRESSION: 1. Cardiomegaly and moderate edema compatible with congestive heart failure. 2. No focal airspace consolidation. Electronically Signed   By: Marin Robertshristopher  Mattern M.D.   On: 08/18/2015 09:33   Admission HPI: 35 y/o african [place] man with  PMHx of hypertension and morbid obesity presents to ED via personal vehicle for headache and hypertension. This headache started Friday morning with improvement during the day then woke him up at 4am this morning and persisted. The pain is achy, throbbing in character and similar to past episodes. It did not improve with goody powder. He takes HCTZ , lopressor  BID, and lisinopril  as home medicines but has been out of all these for the past week and a half. He denies any associated vision changes, weakness,  dizziness, shortness of breath, or chest pain. He has not observed a recent weight change or leg swelling. He was previously feeling well.   He was previously admitted to [facility] in 01/2015 for malignant hypertension with acute renal failure. During that visit evaluation was negative for RAS or hyperaldosteronism. TTE showed LVEF 60-65% with mild LVH and pulmonary vascular congestion. 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 He was discharged on lopressor, lisinopril, and hydralazine.  On arrival to the ED he was found to be hypertensive to 250s/150s. He was placed on supplemental oxygen for an episode of hypoxia to 89% CXR was obtained showing mild vascular congestion. BNP of 207.1.  Hospital Course by problem list:  Acute systolic congestive heart failure exacerbation: His blood pressure improved rapidly with lasix  IV and resuming home medications. Nitrate patch was administered however he reported worsening headache and confusion overnight. Patch removed with relief of symptoms. In the morning he remained hypertensive to 160s-180s which was an appropriate decrease from his initial presentation and his headache was fully resolved. Case management was consulted to discuss local resources and assist with follow up and medications prior to discharge.  Discharge Vitals:   BP 147/88 mmHg  Pulse 72  Temp(Src) 98.4 F (36.9 C) (Oral)  Resp 18  Ht 5' 6.5" (1.689 m)  Wt 167.287 kg (368 lb 12.8 oz)  BMI 58.64 kg/m2  SpO2 96%  Discharge Labs:  No results found for this or any previous visit (from the past 24 hour(s)).  Signed: Fuller Plan, MD 08/21/2015, 12:46 PM

## 2015-08-19 NOTE — Discharge Instructions (Addendum)
Your symptoms are much improved with resuming medications. You will need to follow up at clinic for medication refills and to better manage your blood pressure in the real world. We will help with establishing a PCP or follow up at the [facility] and contact you with appointment information.  I will also recommend you to be referred for sleep study after that visit if they feel it appropriate. In the meantime please contact the clinic or ED sooner if your symptoms worsen significantly.

## 2015-08-19 NOTE — Progress Notes (Signed)
Patient discharge to home at 16:50 pm accompanied by NT and patient's father via wheelchair. Discharge instructions given. Patient  verbalized understanding. All personal belongings given. Telemetry box and IV removed prior to discharge and site in good condition.

## 2015-08-20 LAB — HEMOGLOBIN A1C
Hgb A1c MFr Bld: 6.3 % — ABNORMAL HIGH (ref 4.8–5.6)
Mean Plasma Glucose: 134 mg/dL

## 2015-08-20 MED FILL — hydrALAZINE HCL 25 MG TABS: 25 | 30 days supply | Qty: 90 | Fill #0

## 2015-08-20 MED FILL — HYDROCHLOROTHIAZIDE 25 MG T: 25 | 30 days supply | Qty: 30 | Fill #0

## 2015-08-20 MED FILL — POTASSIUM CL ER 20 MEQ TAB: 20 | 30 days supply | Qty: 30 | Fill #0

## 2015-08-20 MED FILL — LISINOPRIL 20 MG TABLET: 20 | 30 days supply | Qty: 30 | Fill #0

## 2015-08-20 MED FILL — ?METOPROLOL 50 MG TABLET: 50 | 30 days supply | Qty: 60 | Fill #0

## 2015-08-23 ENCOUNTER — Encounter: Payer: Self-pay | Admitting: Physician Assistant

## 2015-08-23 ENCOUNTER — Ambulatory Visit: Payer: Self-pay | Attending: Physician Assistant | Admitting: Physician Assistant

## 2015-08-23 VITALS — BP 160/90 | HR 79 | Temp 98.1°F | Resp 18 | Ht 66.0 in | Wt 368.8 lb

## 2015-08-23 DIAGNOSIS — I10 Essential (primary) hypertension: Secondary | ICD-10-CM | POA: Insufficient documentation

## 2015-08-23 DIAGNOSIS — I509 Heart failure, unspecified: Secondary | ICD-10-CM | POA: Insufficient documentation

## 2015-08-23 DIAGNOSIS — J45909 Unspecified asthma, uncomplicated: Secondary | ICD-10-CM | POA: Insufficient documentation

## 2015-08-23 DIAGNOSIS — G4733 Obstructive sleep apnea (adult) (pediatric): Secondary | ICD-10-CM

## 2015-08-23 DIAGNOSIS — E876 Hypokalemia: Secondary | ICD-10-CM | POA: Insufficient documentation

## 2015-08-23 DIAGNOSIS — R51 Headache: Secondary | ICD-10-CM | POA: Insufficient documentation

## 2015-08-23 DIAGNOSIS — E669 Obesity, unspecified: Secondary | ICD-10-CM | POA: Insufficient documentation

## 2015-08-23 DIAGNOSIS — R739 Hyperglycemia, unspecified: Secondary | ICD-10-CM | POA: Insufficient documentation

## 2015-08-23 DIAGNOSIS — Z79899 Other long term (current) drug therapy: Secondary | ICD-10-CM | POA: Insufficient documentation

## 2015-08-23 LAB — BASIC METABOLIC PANEL
BUN: 28 mg/dL — AB (ref 7–25)
CALCIUM: 9.5 mg/dL (ref 8.6–10.3)
CO2: 30 mmol/L (ref 20–31)
CREATININE: 1.49 mg/dL — AB (ref 0.60–1.35)
Chloride: 96 mmol/L — ABNORMAL LOW (ref 98–110)
GLUCOSE: 109 mg/dL — AB (ref 65–99)
Potassium: 4 mmol/L (ref 3.5–5.3)
Sodium: 141 mmol/L (ref 135–146)

## 2015-08-23 NOTE — Progress Notes (Signed)
Patient's here for hospital f/up for CHF,HTN.   Patient states he's feeling better today, but still having HA's reports that they only comes at night rated 7/10, described as throbbing, pressure pain on top of head, off and on.  Patient reports taking his meds this morning.  Patient declines flu shot.

## 2015-08-23 NOTE — Progress Notes (Signed)
Patient ID: Justin Brown, male   DOB: 04-May-1981, 35 y.o.   MRN: [id number]   Justin Brown, is a 35 y.o. male  JYN:[id number]  QMV:[id number]  DOB - 1981-02-11  Chief Complaint  Patient presents with  . Hospitalization Follow-up  . Congestive Heart Failure  . Hypertension        Subjective:   Justin Brown is a 35 y.o. male here today for an ER follow up visit.  He was seen in the ER and had an overnight admission for headaches and uncontrolled Htn with hypertensive emergency and CHF. He had a CT/MRI of his head at [facility] in 01/2015 that showed multiple old infarcts and an old hemorrhagic stroke.  He has had longstanding Htn with non-compliance with treatment being an ongoing issue.  He had been out of his BP meds for at least a few weeks prior to his hospitalization. He has been back on his BP meds since the hospitalization.  His headaches are improving although he is still having a mild headache during the night.  He denies vision changes, dizziness, or CP.  He is here to establish care and wants to be more aggressive about his health.  He does not smoke.  Rare alcohol(last intake around Christmas).  No chest pain, No abdominal pain - No Nausea, No new weakness tingling or numbness, No Cough - SOB.  He says he was not told about his elevated blood sugar while he was in the hospital  ROS: otherwise negative  ALLERGIES: No Known Allergies  PAST MEDICAL HISTORY: Past Medical History  Diagnosis Date  . Asthma     as child  . Obesity   . Asthma   . Hypertension   . Obesity     MEDICATIONS AT HOME: Prior to Admission medications   Medication Sig Start Date End Date Taking? Authorizing Provider  hydrALAZINE (APRESOLINE) 25 MG tablet Take 1 tablet (25 mg total) by mouth 3 (three) times daily. 08/19/15  Yes Justin Plan, MD  hydrochlorothiazide (HYDRODIURIL) 25 MG tablet Take 1 tablet (25 mg total) by mouth daily. 08/19/15  Yes Justin Plan,  MD  lisinopril (PRINIVIL,ZESTRIL) 20 MG tablet Take 1 tablet (20 mg total) by mouth daily. 08/19/15  Yes Justin Plan, MD  metoprolol (LOPRESSOR) 50 MG tablet Take 1 tablet (50 mg total) by mouth 2 (two) times daily. 08/19/15 08/18/16 Yes Justin Plan, MD  potassium chloride 20 MEQ TBCR Take 20 mEq by mouth daily. 08/19/15  Yes Justin Plan, MD     Objective:   Filed Vitals:   08/23/15 1115 08/23/15 1118  BP:  160/90  Pulse: 79   Temp: 98.1 F (36.7 C)   TempSrc: Oral   Resp: 18   Height:  (1.676 m)   Weight: 368 lb 12.8 oz (167.287 kg)   SpO2: 95%     Exam General appearance : Awake, alert, not in any distress. Speech Clear. Not toxic looking HEENT: Atraumatic and Normocephalic, pupils equally reactive to light and accomodation.  No A-V nicking or fundoscopic changes visualized.  No papilledema Neck: supple, no JVD. No cervical lymphadenopathy.  Chest:Good air entry bilaterally, no added sounds  CVS: S1 S2 regular, no murmurs. No S3. Extremities: B/L Lower Ext shows 1+ edema B without pitting, both legs are warm to touch Neurology: Awake alert, and oriented X 3, CN II-XII intact, Non focal Skin:No Rash  Data Review Lab Results  Component Value Date   HGBA1C 6.3* 08/18/2015  Assessment & Brown   1. Hypokalemia - Basic metabolic panel  2. Hyperglycemia-discussed aggressive dietary changes and adding mild exercise as tolerated.  Weight loss and lifestyle changes are going to be imperative for his overall health and well-being.  Will have him meet with Asher MuirJamie or Kennyth ArnoldStacy at his f/up visit for more intensive nutrition counseling.  He seems motivated for change.  Watch weights closely at f/up to monitor dietary compliance and weight loss progress.  Consider adding glycemic agents if not making progress.   3. Essential hypertension with recent non-compliance and hypertensive emergency His BP is improving/coming down now that he is back on his meds.  Headaches  are improving.  4. Obstructive sleep apnea-likely contributing to overall health issues and will likely require CPAP - Nocturnal polysomnography (NPSG); Future  Patient have been counseled extensively about nutrition and exercise.  Continue current medications.  Return in about 3 weeks (around 09/13/2015) for recheck weight,  htn, headaches, hyperglycemia; establish care. Will have him meet with Asher MuirJamie or Kennyth ArnoldStacy at his f/up visit for more intensive nutrition counseling.  The patient was given clear instructions to go to ER or return to medical center if symptoms don't improve, worsen or new problems develop. The patient verbalized understanding. The patient was told to call to get lab results if they haven't heard anything in the next week.    Justin CoAngela McClung, PA-C [facility] [place], [place] [phone]   08/23/2015, 12:27 PM

## 2015-08-23 NOTE — Patient Instructions (Addendum)
Hypertension Hypertension, commonly called high blood pressure, is when the force of blood pumping through your arteries is too strong. Your arteries are the blood vessels that carry blood from your heart throughout your body. A blood pressure reading consists of a higher number over a lower number, such as 110/72. The higher number (systolic) is the pressure inside your arteries when your heart pumps. The lower number (diastolic) is the pressure inside your arteries when your heart relaxes. Ideally you want your blood pressure below 120/80. Hypertension forces your heart to work harder to pump blood. Your arteries may become narrow or stiff. Having untreated or uncontrolled hypertension can cause heart attack, stroke, kidney disease, and other problems. RISK FACTORS Some risk factors for high blood pressure are controllable. Others are not.  Risk factors you cannot control include:   Race. You may be at higher risk if you are African American.  Age. Risk increases with age.  Gender. Men are at higher risk than women before age 45 years. After age 65, women are at higher risk than men. Risk factors you can control include:  Not getting enough exercise or physical activity.  Being overweight.  Getting too much fat, sugar, calories, or salt in your diet.  Drinking too much alcohol. SIGNS AND SYMPTOMS Hypertension does not usually cause signs or symptoms. Extremely high blood pressure (hypertensive crisis) may cause headache, anxiety, shortness of breath, and nosebleed. DIAGNOSIS To check if you have hypertension, your health care provider will measure your blood pressure while you are seated, with your arm held at the level of your heart. It should be measured at least twice using the same arm. Certain conditions can cause a difference in blood pressure between your right and left arms. A blood pressure reading that is higher than normal on one occasion does not mean that you need treatment. If  it is not clear whether you have high blood pressure, you may be asked to return on a different day to have your blood pressure checked again. Or, you may be asked to monitor your blood pressure at home for 1 or more weeks. TREATMENT Treating high blood pressure includes making lifestyle changes and possibly taking medicine. Living a healthy lifestyle can help lower high blood pressure. You may need to change some of your habits. Lifestyle changes may include:  Following the DASH diet. This diet is high in fruits, vegetables, and whole grains. It is low in salt, red meat, and added sugars.  Keep your sodium intake below 2,300 mg per day.  Getting at least 30-45 minutes of aerobic exercise at least 4 times per week.  Losing weight if necessary.  Not smoking.  Limiting alcoholic beverages.  Learning ways to reduce stress. Your health care provider may prescribe medicine if lifestyle changes are not enough to get your blood pressure under control, and if one of the following is true:  You are 18-59 years of age and your systolic blood pressure is above 140.  You are 60 years of age or older, and your systolic blood pressure is above 150.  Your diastolic blood pressure is above 90.  You have diabetes, and your systolic blood pressure is over 140 or your diastolic blood pressure is over 90.  You have kidney disease and your blood pressure is above 140/90.  You have heart disease and your blood pressure is above 140/90. Your personal target blood pressure may vary depending on your medical conditions, your age, and other factors. HOME CARE INSTRUCTIONS    Have your blood pressure rechecked as directed by your health care provider.   Take medicines only as directed by your health care provider. Follow the directions carefully. Blood pressure medicines must be taken as prescribed. The medicine does not work as well when you skip doses. Skipping doses also puts you at risk for  problems.  Do not smoke.   Monitor your blood pressure at home as directed by your health care provider. SEEK MEDICAL CARE IF:   You think you are having a reaction to medicines taken.  You have recurrent headaches or feel dizzy.  You have swelling in your ankles.  You have trouble with your vision. SEEK IMMEDIATE MEDICAL CARE IF:  You develop a severe headache or confusion.  You have unusual weakness, numbness, or feel faint.  You have severe chest or abdominal pain.  You vomit repeatedly.  You have trouble breathing. MAKE SURE YOU:   Understand these instructions.  Will watch your condition.  Will get help right away if you are not doing well or get worse.   This information is not intended to replace advice given to you by your health care provider. Make sure you discuss any questions you have with your health care provider.   Document Released: 05/26/2005 Document Revised: 10/10/2014 Document Reviewed: 03/18/2013 Elsevier Interactive Patient Education 2016 [employer] Basic Carbohydrate Counting for Diabetes Mellitus Carbohydrate counting is a method for keeping track of the amount of carbohydrates you eat. Eating carbohydrates naturally increases the level of sugar (glucose) in your blood, so it is important for you to know the amount that is okay for you to have in every meal. Carbohydrate counting helps keep the level of glucose in your blood within normal limits. The amount of carbohydrates allowed is different for every person. A dietitian can help you calculate the amount that is right for you. Once you know the amount of carbohydrates you can have, you can count the carbohydrates in the foods you want to eat. Carbohydrates are found in the following foods:  Grains, such as breads and cereals.  Dried beans and soy products.  Starchy vegetables, such as potatoes, peas, and corn.  Fruit and fruit juices.  Milk and yogurt.  Sweets and snack foods, such  as cake, cookies, candy, chips, soft drinks, and fruit drinks. CARBOHYDRATE COUNTING There are two ways to count the carbohydrates in your food. You can use either of the methods or a combination of both. Reading the "Nutrition Facts" on Packaged Food The "Nutrition Facts" is an area that is included on the labels of almost all packaged food and beverages in the [place]. It includes the serving size of that food or beverage and information about the nutrients in each serving of the food, including the grams (g) of carbohydrate per serving.  Decide the number of servings of this food or beverage that you will be able to eat or drink. Multiply that number of servings by the number of grams of carbohydrate that is listed on the label for that serving. The total will be the amount of carbohydrates you will be having when you eat or drink this food or beverage. Learning Standard Serving Sizes of Food When you eat food that is not packaged or does not include "Nutrition Facts" on the label, you need to measure the servings in order to count the amount of carbohydrates.A serving of most carbohydrate-rich foods contains about 15 g of carbohydrates. The following list includes serving sizes of carbohydrate-rich foods  that provide 15 g ofcarbohydrate per serving:   1 slice of bread (1 oz) or 1 six-inch tortilla.    of a hamburger bun or English muffin.  4-6 crackers.   cup unsweetened dry cereal.    cup hot cereal.   cup rice or pasta.    cup mashed potatoes or  of a large baked potato.  1 cup fresh fruit or one small piece of fruit.    cup canned or frozen fruit or fruit juice.  1 cup milk.   cup plain fat-free yogurt or yogurt sweetened with artificial sweeteners.   cup cooked dried beans or starchy vegetable, such as peas, corn, or potatoes.  Decide the number of standard-size servings that you will eat. Multiply that number of servings by 15 (the grams of carbohydrates  in that serving). For example, if you eat 2 cups of strawberries, you will have eaten 2 servings and 30 g of carbohydrates (2 servings x 15 g = 30 g). For foods such as soups and casseroles, in which more than one food is mixed in, you will need to count the carbohydrates in each food that is included. EXAMPLE OF CARBOHYDRATE COUNTING Sample Dinner  3 oz chicken breast.   cup of brown rice.   cup of corn.  1 cup milk.   1 cup strawberries with sugar-free whipped topping.  Carbohydrate Calculation Step 1: Identify the foods that contain carbohydrates:   Rice.   Corn.   Milk.   Strawberries. Step 2:Calculate the number of servings eaten of each:   2 servings of rice.   1 serving of corn.   1 serving of milk.   1 serving of strawberries. Step 3: Multiply each of those number of servings by 15 g:   2 servings of rice x 15 g = 30 g.   1 serving of corn x 15 g = 15 g.   1 serving of milk x 15 g = 15 g.   1 serving of strawberries x 15 g = 15 g. Step 4: Add together all of the amounts to find the total grams of carbohydrates eaten: 30 g + 15 g + 15 g + 15 g = 75 g.   This information is not intended to replace advice given to you by your health care provider. Make sure you discuss any questions you have with your health care provider.   Document Released: 05/26/2005 Document Revised: 06/16/2014 Document Reviewed: 04/22/2013 Elsevier Interactive Patient Education [employer]

## 2015-08-23 NOTE — Addendum Note (Signed)
Addended by: Anders SimmondsMCCLUNG, Lylian Sanagustin M on: 08/23/2015 02:12 PM   Modules accepted: [employer]od[employer]Set

## 2015-10-01 ENCOUNTER — Other Ambulatory Visit: Payer: Self-pay

## 2015-10-01 NOTE — Telephone Encounter (Signed)
Patient called and requested a med refill for all of his current medications. Please f/u with pt.

## 2015-10-10 ENCOUNTER — Ambulatory Visit: Payer: Self-pay | Admitting: Internal Medicine

## 2015-10-10 MED ORDER — LISINOPRIL 20 MG PO TABS: 20.0000 mg | ORAL_TABLET | Freq: Every day | ORAL | Status: DC

## 2015-10-10 MED ORDER — HYDROCHLOROTHIAZIDE 25 MG PO TABS: 25.0000 mg | ORAL_TABLET | Freq: Every day | ORAL | Status: DC

## 2015-10-10 MED ORDER — HYDRALAZINE HCL 25 MG PO TABS: 25.0000 mg | ORAL_TABLET | Freq: Three times a day (TID) | ORAL | Status: DC

## 2015-10-10 MED ORDER — METOPROLOL TARTRATE 50 MG PO TABS: 50.0000 mg | ORAL_TABLET | Freq: Two times a day (BID) | ORAL | Status: DC

## 2015-10-10 NOTE — Telephone Encounter (Signed)
All medications refilled x 1 month

## 2015-10-10 NOTE — Telephone Encounter (Signed)
Patient is following up on medication refill request  for all his medications....please follow up with patient he is completely out

## 2015-11-05 ENCOUNTER — Ambulatory Visit: Payer: Self-pay | Admitting: Internal Medicine

## 2015-11-06 ENCOUNTER — Ambulatory Visit: Payer: Self-pay | Admitting: Internal Medicine

## 2015-11-09 MED FILL — ?METOPROLOL 50 MG TABLET: 50 | 30 days supply | Qty: 60 | Fill #0

## 2015-11-09 MED FILL — hydrALAZINE HCL 25 MG TABS: 25 | 30 days supply | Qty: 90 | Fill #0

## 2015-11-09 MED FILL — LISINOPRIL 20 MG TABLET: 20 | 30 days supply | Qty: 30 | Fill #0

## 2015-11-09 MED FILL — ?HYDROCHLOROTHIAZIDE 25 MG: 25 MG | 30 days supply | Qty: 30 | Fill #0

## 2015-12-20 ENCOUNTER — Other Ambulatory Visit: Payer: Self-pay | Admitting: Physician Assistant

## 2016-01-11 ENCOUNTER — Telehealth: Payer: Self-pay | Admitting: Internal Medicine

## 2016-01-11 MED ORDER — LISINOPRIL 20 MG PO TABS: 20.0000 mg | ORAL_TABLET | Freq: Every day | ORAL | 0 refills | Status: AC

## 2016-01-11 MED ORDER — HYDRALAZINE HCL 25 MG PO TABS: 25.0000 mg | ORAL_TABLET | Freq: Three times a day (TID) | ORAL | 0 refills | Status: AC

## 2016-01-11 MED ORDER — HYDROCHLOROTHIAZIDE 25 MG PO TABS
25.0000 mg | ORAL_TABLET | Freq: Every day | ORAL | 0 refills | Status: AC
Start: 2016-01-11 — End: ?

## 2016-01-11 MED ORDER — METOPROLOL TARTRATE 50 MG PO TABS: 50.0000 mg | ORAL_TABLET | Freq: Two times a day (BID) | ORAL | 0 refills | Status: AC

## 2016-01-11 MED FILL — ?METOPROLOL 50 MG TABLET: 50 | 30 days supply | Qty: 60 | Fill #0

## 2016-01-11 MED FILL — HYDROCHLOROTHIAZIDE 25 MG T: 25 | 30 days supply | Qty: 30 | Fill #0

## 2016-01-11 MED FILL — ?LISINOPRIL 20 MG TABLET: 20 | 30 days supply | Qty: 30 | Fill #0

## 2016-01-11 MED FILL — hydrALAZINE HCL 25 MG TABS: 25 | 30 days supply | Qty: 90 | Fill #0

## 2016-01-11 NOTE — Telephone Encounter (Signed)
Requested medications filled x 30 days

## 2016-01-11 NOTE — Telephone Encounter (Signed)
Medication Refill: hydrALAZINE (APRESOLINE) 25 MG tablet [[id number]]  hydrochlorothiazide (HYDRODIURIL) 25 MG tablet [[id number]]  lisinopril (PRINIVIL,ZESTRIL) 20 MG tablet [[id number]]  metoprolol (LOPRESSOR) 50 MG tablet [[id number]]    Pt was advised that he will need to set up an appointment to establish care with one of our practicing providers. Pt will call back next week to schedule due to our two week scheduling policy

## 2016-06-04 ENCOUNTER — Other Ambulatory Visit: Payer: Self-pay | Admitting: Internal Medicine

## 2016-06-17 ENCOUNTER — Other Ambulatory Visit: Payer: Self-pay | Admitting: Internal Medicine

## 2016-07-16 ENCOUNTER — Ambulatory Visit: Payer: Self-pay | Admitting: Family Medicine

## 2016-12-07 DEATH — deceased

## 2017-12-30 IMAGING — DX DG CHEST 2V
2 series · 2 of 2 positions shown · non-contrast
Comparison: Two-view chest x-ray 12/24/2011

CLINICAL DATA: Severe hypertension.  Hypoxia.  Headache.

EXAM:
CHEST - 2 VIEW

[w chest pa]
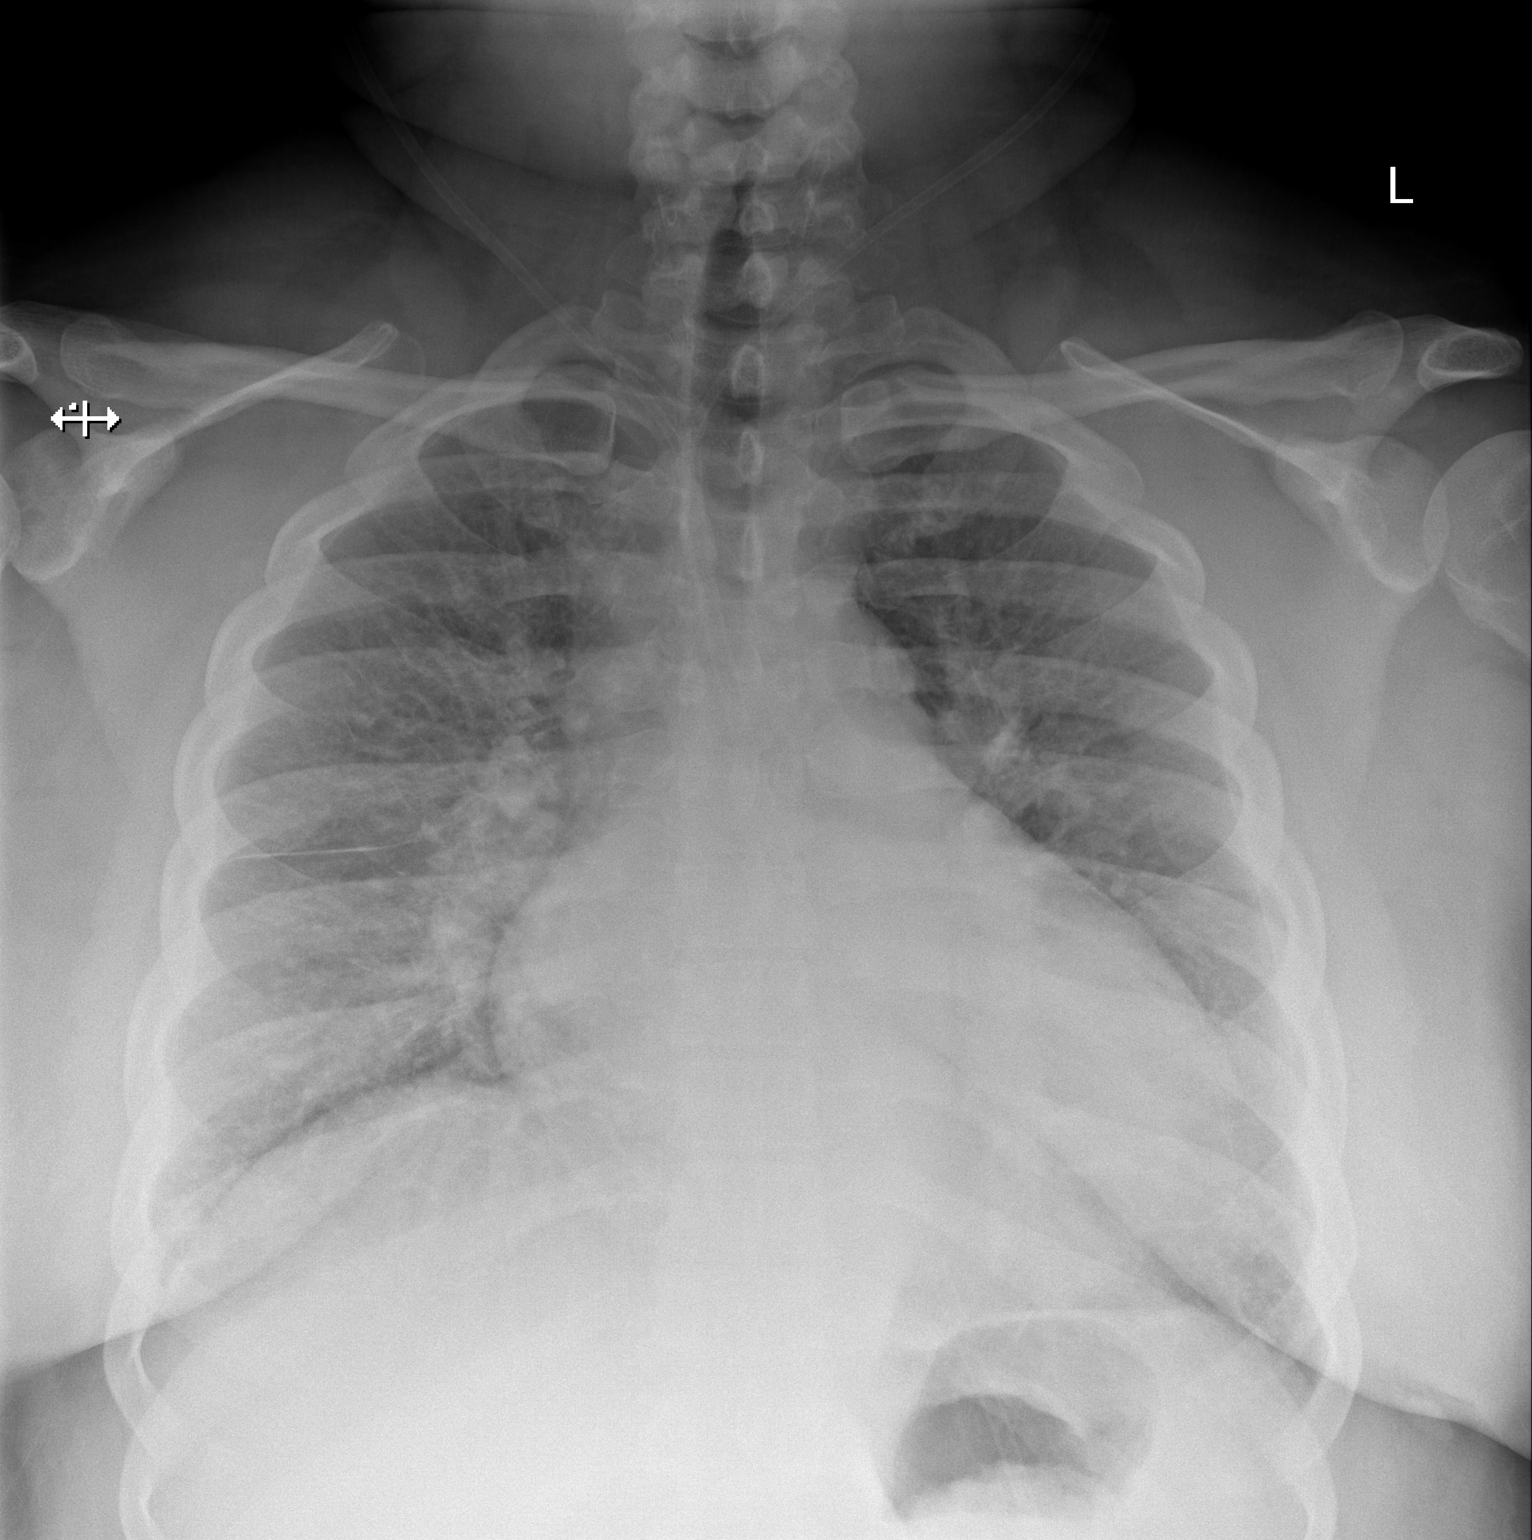

[w chest lat]
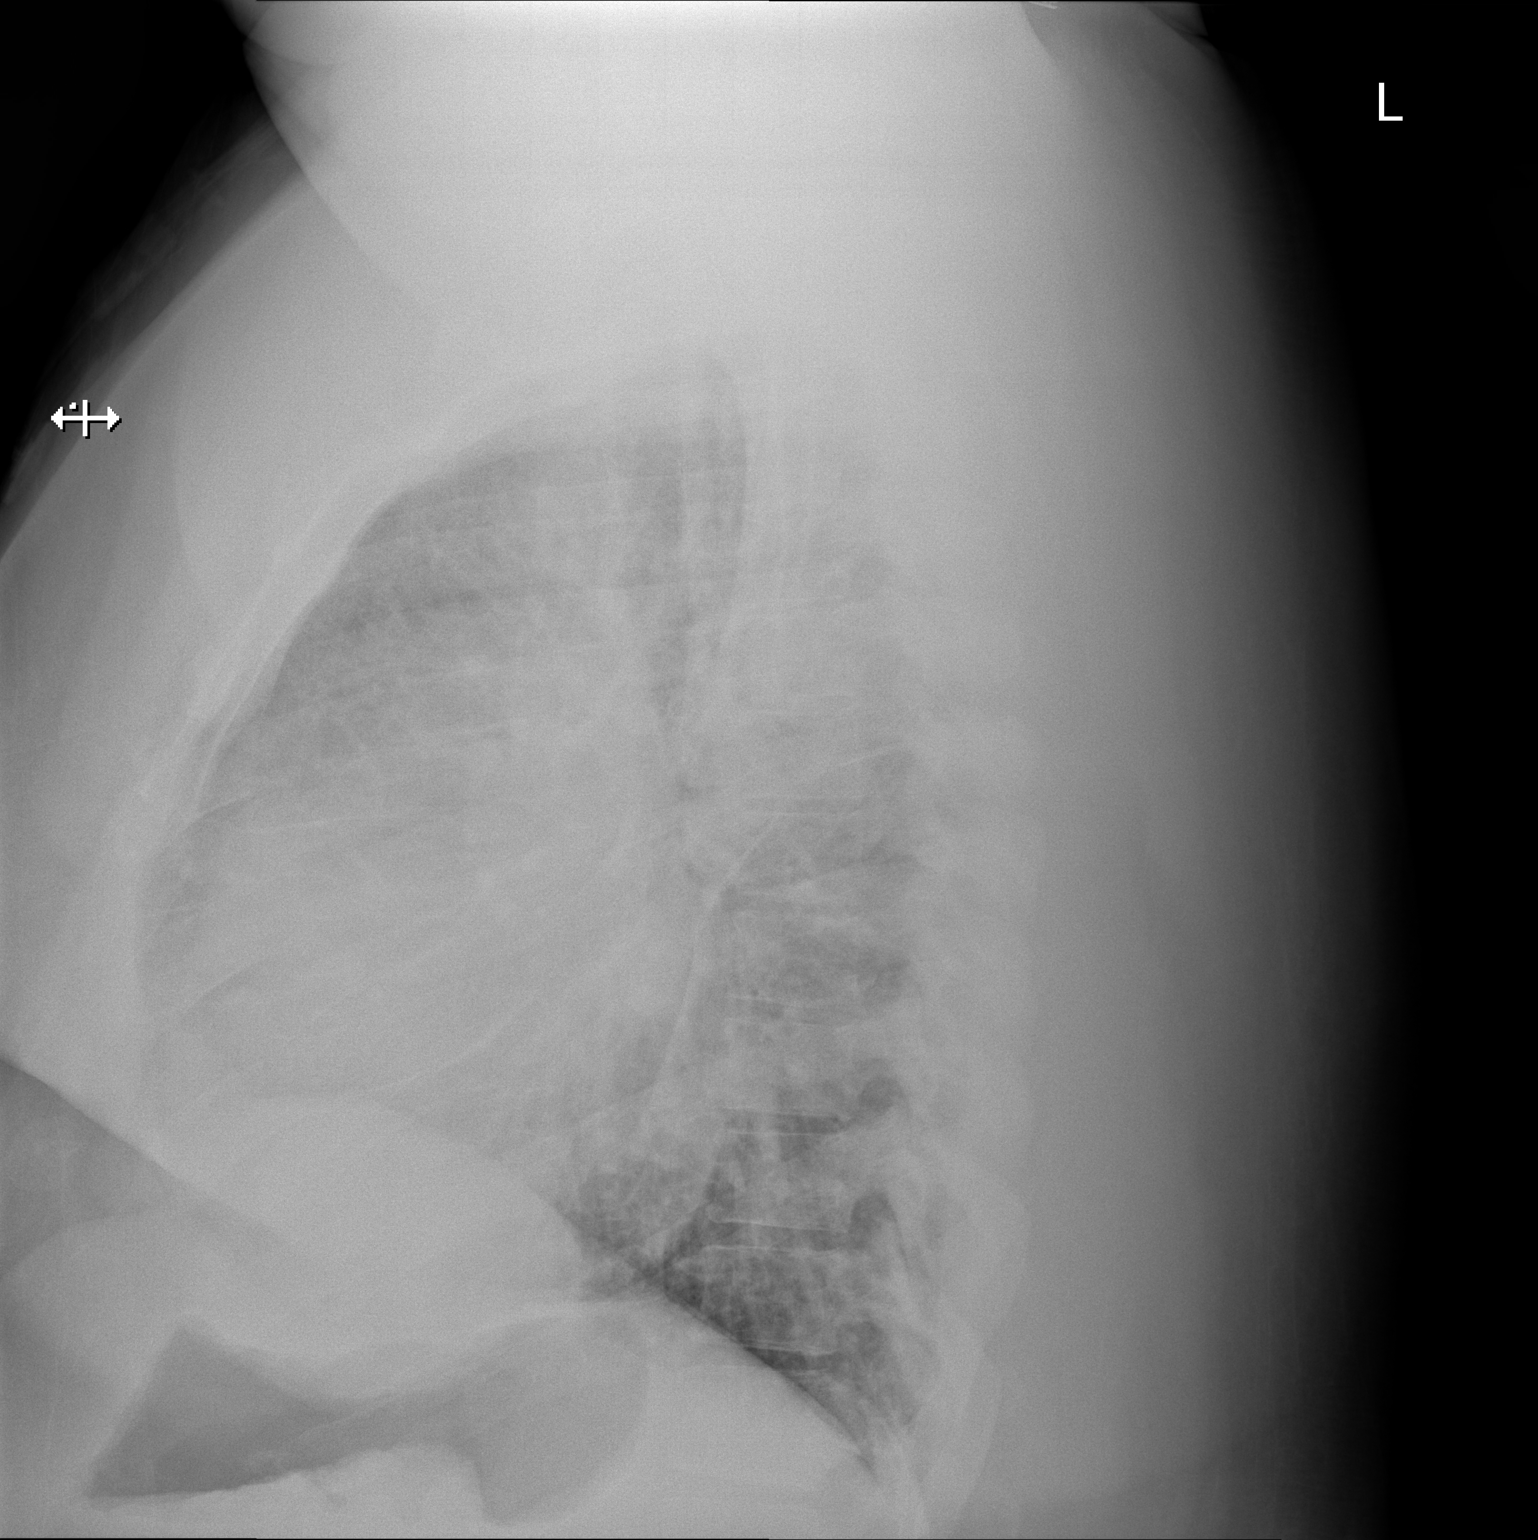

[2 of 2 positions shown; findings below may reference images not displayed]

FINDINGS: The heart is enlarged. Moderate diffuse edema is present. There are
no effusions. There is no significant airspace consolidation. The
visualized soft tissues and bony thorax are unremarkable.
IMPRESSION: 1. Cardiomegaly and moderate edema compatible with congestive heart
failure.
2. No focal airspace consolidation.
